# Patient Record
Sex: Female | Born: 1985 | Race: White | Hispanic: No | Marital: Single | Smoking: Current every day smoker
Health system: Southern US, Community
[De-identification: ages and names within clinical notes are randomized; demographics above are authoritative.]

## PROBLEM LIST (undated history)

## (undated) DIAGNOSIS — F1910 Other psychoactive substance abuse, uncomplicated: Secondary | ICD-10-CM

## (undated) DIAGNOSIS — E039 Hypothyroidism, unspecified: Secondary | ICD-10-CM

## (undated) HISTORY — PX: NO PAST SURGERIES: SHX2092

## (undated) HISTORY — DX: Hypothyroidism, unspecified: E03.9

---
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
## 2007-03-23 ENCOUNTER — Ambulatory Visit (HOSPITAL_COMMUNITY): Admission: RE | Admit: 2007-03-23 | Discharge: 2007-03-23 | Payer: Self-pay | Admitting: Internal Medicine

## 2008-03-02 ENCOUNTER — Ambulatory Visit (HOSPITAL_COMMUNITY): Admission: RE | Admit: 2008-03-02 | Discharge: 2008-03-02 | Payer: Self-pay | Admitting: Family Medicine

## 2008-04-14 ENCOUNTER — Emergency Department (HOSPITAL_COMMUNITY): Admission: EM | Admit: 2008-04-14 | Discharge: 2008-04-14 | Payer: Self-pay | Admitting: Emergency Medicine

## 2008-08-02 ENCOUNTER — Emergency Department (HOSPITAL_COMMUNITY): Admission: EM | Admit: 2008-08-02 | Discharge: 2008-08-02 | Payer: Self-pay | Admitting: Emergency Medicine

## 2008-09-28 ENCOUNTER — Emergency Department (HOSPITAL_COMMUNITY): Admission: EM | Admit: 2008-09-28 | Discharge: 2008-09-29 | Payer: Self-pay | Admitting: Emergency Medicine

## 2008-10-24 ENCOUNTER — Emergency Department (HOSPITAL_COMMUNITY): Admission: EM | Admit: 2008-10-24 | Discharge: 2008-10-24 | Payer: Self-pay | Admitting: Emergency Medicine

## 2009-05-19 ENCOUNTER — Emergency Department (HOSPITAL_COMMUNITY): Admission: EM | Admit: 2009-05-19 | Discharge: 2009-05-19 | Payer: Self-pay | Admitting: Emergency Medicine

## 2010-09-09 LAB — URINALYSIS, ROUTINE W REFLEX MICROSCOPIC
Nitrite: NEGATIVE
Urobilinogen, UA: 0.2 mg/dL (ref 0.0–1.0)

## 2010-09-09 LAB — PREGNANCY, URINE: Preg Test, Ur: NEGATIVE

## 2010-09-09 LAB — URINE MICROSCOPIC-ADD ON

## 2010-12-22 ENCOUNTER — Ambulatory Visit: Payer: Self-pay | Admitting: Obstetrics and Gynecology

## 2011-05-01 ENCOUNTER — Other Ambulatory Visit: Payer: Self-pay | Admitting: Internal Medicine

## 2011-05-01 DIAGNOSIS — E0590 Thyrotoxicosis, unspecified without thyrotoxic crisis or storm: Secondary | ICD-10-CM

## 2011-05-06 ENCOUNTER — Ambulatory Visit (HOSPITAL_COMMUNITY): Payer: Self-pay

## 2011-05-07 ENCOUNTER — Other Ambulatory Visit (HOSPITAL_COMMUNITY): Payer: Self-pay

## 2011-06-03 ENCOUNTER — Encounter (HOSPITAL_COMMUNITY)
Admission: RE | Admit: 2011-06-03 | Discharge: 2011-06-03 | Disposition: A | Discharge status: Home / Self Care | Source: Ambulatory Visit | Attending: Internal Medicine | Admitting: Internal Medicine

## 2011-06-03 ENCOUNTER — Encounter (HOSPITAL_COMMUNITY): Payer: Self-pay

## 2011-06-03 DIAGNOSIS — E0590 Thyrotoxicosis, unspecified without thyrotoxic crisis or storm: Secondary | ICD-10-CM | POA: Insufficient documentation

## 2011-06-03 MED ORDER — SODIUM IODIDE I 131 CAPSULE
10.0000 | Freq: Once | INTRAVENOUS | Status: AC | PRN
  Administered 2011-06-03: 9 via ORAL

## 2011-06-04 ENCOUNTER — Ambulatory Visit (HOSPITAL_COMMUNITY): Payer: Self-pay

## 2011-06-04 ENCOUNTER — Encounter (HOSPITAL_COMMUNITY)
Admission: RE | Admit: 2011-06-04 | Discharge: 2011-06-04 | Disposition: A | Discharge status: Home / Self Care | Source: Ambulatory Visit | Attending: Internal Medicine | Admitting: Internal Medicine

## 2011-06-04 MED ORDER — SODIUM PERTECHNETATE TC 99M INJECTION
10.0000 | Freq: Once | INTRAVENOUS | Status: AC | PRN
  Administered 2011-06-04: 9.5 via INTRAVENOUS

## 2011-06-05 ENCOUNTER — Ambulatory Visit (INDEPENDENT_AMBULATORY_CARE_PROVIDER_SITE_OTHER)

## 2011-06-05 DIAGNOSIS — J111 Influenza due to unidentified influenza virus with other respiratory manifestations: Secondary | ICD-10-CM

## 2011-06-30 ENCOUNTER — Ambulatory Visit (INDEPENDENT_AMBULATORY_CARE_PROVIDER_SITE_OTHER): Admitting: Internal Medicine

## 2011-06-30 DIAGNOSIS — N926 Irregular menstruation, unspecified: Secondary | ICD-10-CM | POA: Insufficient documentation

## 2011-06-30 DIAGNOSIS — L659 Nonscarring hair loss, unspecified: Secondary | ICD-10-CM

## 2011-06-30 DIAGNOSIS — E063 Autoimmune thyroiditis: Secondary | ICD-10-CM

## 2011-06-30 DIAGNOSIS — F411 Generalized anxiety disorder: Secondary | ICD-10-CM

## 2011-06-30 DIAGNOSIS — F419 Anxiety disorder, unspecified: Secondary | ICD-10-CM

## 2011-06-30 DIAGNOSIS — L608 Other nail disorders: Secondary | ICD-10-CM

## 2011-06-30 DIAGNOSIS — L603 Nail dystrophy: Secondary | ICD-10-CM | POA: Insufficient documentation

## 2011-06-30 DIAGNOSIS — F329 Major depressive disorder, single episode, unspecified: Secondary | ICD-10-CM

## 2011-06-30 LAB — POCT CBC
Granulocyte percent: 54.1 %G (ref 37–80)
HCT, POC: 37.1 % — AB (ref 37.7–47.9)
Hemoglobin: 12 g/dL — AB (ref 12.2–16.2)
Lymph, poc: 2.4 (ref 0.6–3.4)
MID (cbc): 0.4 (ref 0–0.9)
POC Granulocyte: 3.3 (ref 2–6.9)
POC MID %: 6.6 %M (ref 0–12)
Platelet Count, POC: 278 10*3/uL (ref 142–424)
WBC: 6.1 10*3/uL (ref 4.6–10.2)

## 2011-06-30 LAB — COMPREHENSIVE METABOLIC PANEL
ALT: 14 U/L (ref 0–35)
CO2: 27 mEq/L (ref 19–32)
Potassium: 4.3 mEq/L (ref 3.5–5.3)
Sodium: 139 mEq/L (ref 135–145)
Total Bilirubin: 0.3 mg/dL (ref 0.3–1.2)

## 2011-06-30 MED ORDER — ALPRAZOLAM 1 MG PO TABS: 1.0000 mg | ORAL_TABLET | Freq: Two times a day (BID) | ORAL | Status: DC

## 2011-06-30 MED ORDER — CITALOPRAM HYDROBROMIDE 20 MG PO TABS: 40.0000 mg | ORAL_TABLET | Freq: Every day | ORAL | Status: DC

## 2011-06-30 NOTE — Progress Notes (Addendum)
  Subjective:    Patient ID: Nina Herrera, female    DOB: 11-29-1985, 26 y.o.   MRN: [id number]  HPI Comments: icreased acne....very easily irritated Ankles swell at end of day--ankles lock in am  Hx flat feet  Thyroid Problem Presents for follow-up visit. Symptoms include anxiety, depressed mood, fatigue, hair loss, heat intolerance, menstrual problem, nail problem and weight gain. Patient reports no constipation, diarrhea, dry skin, leg swelling, palpitations, visual change or weight loss. The symptoms have been worsening.   she was originally sent to [place] in November by her gynecologist during her workup for infertility. Her progesterone was low and her thyroid hormones also were we found that she had a low TSH with a high T3 but a normal T4.  Thyroid para oxidase antibody was also positive at 386. She had a mild normocytic anemia with a history of iron deficiency anemia  She has been on Celexa and Xanax for anxiety and depression and was doing very well until the last 2 months. All of her symptoms have exacerbated without any clear psychosocial coughs. Her job and her relationship are both stable here. She continues to have irregular menses and hasn't still not been able to get pregnant.    Review of Systems  Constitutional: Positive for weight gain, activity change, fatigue and unexpected weight change. Negative for weight loss.  Cardiovascular: Negative for palpitations.  Gastrointestinal: Negative for nausea, diarrhea, constipation and abdominal distention.  Genitourinary: Positive for menstrual problem. Negative for frequency and difficulty urinating.  Musculoskeletal: Negative for arthralgias.  Hematological: Positive for heat intolerance.  Psychiatric/Behavioral: Positive for sleep disturbance, dysphoric mood and agitation. Negative for suicidal ideas. The patient is nervous/anxious.    she has only had 4 period since August. She hasn't been evaluated by ophthalmology for clear  discharge from her prescription which they thought was allergic to even though she has no allergic rhinitis by history      Objective:   Physical Exam  Constitutional: She is oriented to person, place, and time. She appears well-developed.  HENT:  Head: Normocephalic.  Eyes: Conjunctivae and EOM are normal. Pupils are equal, round, and reactive to light.  Neck: Normal range of motion. Neck supple. No thyromegaly present.  Cardiovascular: Normal rate, regular rhythm and normal heart sounds.   No murmur heard. Pulmonary/Chest: Breath sounds normal.  Abdominal: Soft.  Lymphadenopathy:    She has no cervical adenopathy.  Neurological: She is alert and oriented to person, place, and time.  Skin: Skin is warm and dry.          Assessment & Plan:  She has a confusing array of symptoms with underlying autoimmune thyroiditis. Her thyroid uptake was essentially normal by scan. She is mainly concerned about her irregular menses and infertility. She is also beginning to be much more uncomfortable with her anxiety and irritability and depression that results.  We will recheck her thyroid hormone and TSH. Her CBC is almost normal today with a hemoglobin of 12. Vitamin D and metabolic profile also done.  Celexa it is increased to 40 mg a day. Xanax is continued at 1 mg twice a day when necessary. She is sleeping well.

## 2011-06-30 NOTE — Patient Instructions (Signed)

## 2011-07-01 LAB — VITAMIN D 25 HYDROXY (VIT D DEFICIENCY, FRACTURES): Vit D, 25-Hydroxy: 30 ng/mL (ref 30–89)

## 2011-07-06 ENCOUNTER — Encounter: Payer: Self-pay | Admitting: Internal Medicine

## 2011-09-06 ENCOUNTER — Emergency Department (HOSPITAL_COMMUNITY)
Admission: EM | Admit: 2011-09-06 | Discharge: 2011-09-06 | Disposition: A | Discharge status: Home / Self Care | Attending: Emergency Medicine | Admitting: Emergency Medicine

## 2011-09-06 ENCOUNTER — Encounter (HOSPITAL_COMMUNITY): Payer: Self-pay

## 2011-09-06 DIAGNOSIS — N949 Unspecified condition associated with female genital organs and menstrual cycle: Secondary | ICD-10-CM | POA: Insufficient documentation

## 2011-09-06 DIAGNOSIS — A499 Bacterial infection, unspecified: Secondary | ICD-10-CM | POA: Insufficient documentation

## 2011-09-06 DIAGNOSIS — N760 Acute vaginitis: Secondary | ICD-10-CM | POA: Insufficient documentation

## 2011-09-06 DIAGNOSIS — F17200 Nicotine dependence, unspecified, uncomplicated: Secondary | ICD-10-CM | POA: Insufficient documentation

## 2011-09-06 DIAGNOSIS — R109 Unspecified abdominal pain: Secondary | ICD-10-CM | POA: Insufficient documentation

## 2011-09-06 DIAGNOSIS — B9689 Other specified bacterial agents as the cause of diseases classified elsewhere: Secondary | ICD-10-CM | POA: Insufficient documentation

## 2011-09-06 DIAGNOSIS — R102 Pelvic and perineal pain: Secondary | ICD-10-CM

## 2011-09-06 LAB — URINALYSIS, ROUTINE W REFLEX MICROSCOPIC
Bilirubin Urine: NEGATIVE
Glucose, UA: NEGATIVE mg/dL
Ketones, ur: NEGATIVE mg/dL
Leukocytes, UA: NEGATIVE
Nitrite: NEGATIVE
Protein, ur: NEGATIVE mg/dL
Specific Gravity, Urine: 1.015 (ref 1.005–1.030)
Urobilinogen, UA: 0.2 mg/dL (ref 0.0–1.0)
pH: 8 (ref 5.0–8.0)

## 2011-09-06 LAB — COMPREHENSIVE METABOLIC PANEL
Alkaline Phosphatase: 60 U/L (ref 39–117)
CO2: 30 mEq/L (ref 19–32)
Calcium: 9.9 mg/dL (ref 8.4–10.5)
Chloride: 100 mEq/L (ref 96–112)
Creatinine, Ser: 0.58 mg/dL (ref 0.50–1.10)
GFR calc Af Amer: >90 mL/min (ref >90)
GFR calc non Af Amer: >90 mL/min (ref >90)
Glucose, Bld: 77 mg/dL (ref 70–99)
Potassium: 3.7 mEq/L (ref 3.5–5.1)
Sodium: 137 mEq/L (ref 135–145)
Total Protein: 7.6 g/dL (ref 6.0–8.3)

## 2011-09-06 LAB — CBC
HCT: 35.9 % — ABNORMAL LOW (ref 36.0–46.0)
MCH: 31 pg (ref 26.0–34.0)
MCHC: 35.1 g/dL (ref 30.0–36.0)
RBC: 4.07 MIL/uL (ref 3.87–5.11)
WBC: 7.2 10*3/uL (ref 4.0–10.5)

## 2011-09-06 LAB — PREGNANCY, URINE: Preg Test, Ur: NEGATIVE

## 2011-09-06 LAB — WET PREP, GENITAL

## 2011-09-06 LAB — DIFFERENTIAL
Basophils Absolute: 0 10*3/uL (ref 0.0–0.1)
Eosinophils Absolute: 0.4 10*3/uL (ref 0.0–0.7)

## 2011-09-06 MED ORDER — TRAMADOL HCL 50 MG PO TABS
100.0000 mg | ORAL_TABLET | Freq: Once | ORAL | Status: DC
  Filled 2011-09-06: qty 2

## 2011-09-06 MED ORDER — KETOROLAC TROMETHAMINE 30 MG/ML IJ SOLN
30.0000 mg | Freq: Once | INTRAMUSCULAR | Status: AC
  Administered 2011-09-06: 30 mg via INTRAVENOUS
  Filled 2011-09-06: qty 1

## 2011-09-06 NOTE — ED Notes (Signed)
Notified by Gaspar Garbe, nurse tech, and annalisa, patient advocate, that patient's significant other also voices concerns to them that we thought the patient was drug seeking. Patient's significant other stated to me that there were plenty of people in the waiting room that were obviously drug seeking.

## 2011-09-06 NOTE — ED Notes (Signed)
Patient's significant other came out of room and asked patient advocate annalisa if ultrasound was here on the weekends. annalisa informed patient that they were not and that they're called in for emergencies. Patient voiced concern about ultrasound not being here today. Went into patient's room, patient's significant other stated that by his assessment we thought the patient was drug seeking due to the medications we gave her. Patient and significant other began walking out of room, stated they weren't signing any paperwork because then they would have to pay.

## 2011-09-06 NOTE — ED Notes (Signed)
Pelvic cart set up at bedside  

## 2011-09-06 NOTE — ED Notes (Signed)
Went into patient's room to give patient tramadol pills, patient tearful and denies pain medication. States that she doesn't want anything else for pain. Also requested iv to be removed, stated she didn't see any reason why it needed to be in her arm anymore. Iv removed.

## 2011-09-06 NOTE — ED Provider Notes (Signed)
History   Scribed for Ward Givens, MD, the patient was seen in APOTF/OTF. The chart was scribed by Gilman Schmidt. The patients care was started at 9:13 PM.   CSN: [id number]  Arrival date & time 09/06/11  1801   First MD Initiated Contact with Patient 09/06/11 1847      Chief Complaint  Patient presents with  . Abdominal Pain    (Consider location/radiation/quality/duration/timing/severity/associated sxs/prior treatment) HPI Nina Herrera is a 26 y.o. female who presents to the Emergency Department complaining of lower abdominal pain ~1 hour PTA. Pt reports having sexual intercourse when symptoms presented. Symptoms are exacerbated upon movement. Denies any change from normal in sexual activity. Also notes increased urination. Denies any dysuria, flank pain, nausea, vomiting, or vaginal discharge. LNMP March 14. Pt states she has had thyroid problems since last August and has had irregular menstrual periods but they are starting to get more regular. States she has never been pregnant but is not using birth control and is trying to get pregnant. Pt also reports having ultrasound of abdomen that showed ovarian cysts several years ago while being evaluated for a different problem. There are no other associated symptoms. Pt relates laying flat makes the pain worse, the fetal position makes it feel better.    PCP: Dr. Merla Riches, Gynecologist: Dr. Marla Roe in [place]    History reviewed. No pertinent past medical history.  History reviewed. No pertinent past surgical history.  No family history on file.  History  Substance Use Topics  . Smoking status: Current Everyday Smoker    Types: Cigarettes  . Smokeless tobacco: Not on file  . Alcohol Use: No  employed Lives with spouse   OB History    Grav Para Term Preterm Abortions TAB SAB Ect Mult Living                  Review of Systems  Gastrointestinal: Positive for abdominal pain. Negative for vomiting.  Genitourinary: Positive for  frequency. Negative for dysuria, flank pain and vaginal discharge.  All other systems reviewed and are negative.    Allergies  Review of patient's allergies indicates no known allergies.  Home Medications   Current Outpatient Rx  Name Route Sig Dispense Refill  . ALPRAZOLAM 1 MG PO TABS Oral Take 1 tablet (1 mg total) by mouth 2 (two) times daily. 60 tablet 2    BP 93/57  Pulse 71  Temp(Src) 98.6 F (37 C) (Oral)  Resp 20  Ht 5\' 6"  (1.676 m)  Wt 130 lb (58.968 kg)  BMI 20.98 kg/m2  SpO2 100%  LMP 08/13/2011  Vital signs normal except borderline hypotension   Physical Exam  Constitutional: She appears well-developed and well-nourished.  HENT:  Head: Normocephalic and atraumatic.  Right Ear: External ear normal.  Left Ear: External ear normal.  Nose: Nose normal.  Eyes: Conjunctivae are normal. Pupils are equal, round, and reactive to light.  Neck: Normal range of motion. Neck supple. No tracheal deviation present. No thyromegaly present.  Cardiovascular: Normal rate, regular rhythm and normal heart sounds.   No murmur heard. Pulmonary/Chest: Effort normal and breath sounds normal.  Abdominal: Soft. She exhibits no distension and no mass. Bowel sounds are increased. There is tenderness in the left lower quadrant. There is no rebound, no guarding and no CVA tenderness.       Pt has tenderness in her LLQ.  Genitourinary:       Small amount of discharge Both ovaries tender Mildly tender uterus  Normal size uterus  Musculoskeletal: Normal range of motion. She exhibits no edema and no tenderness.  Neurological: She is alert. Coordination normal.  Skin: Skin is warm and dry. No rash noted.  Psychiatric: She has a normal mood and affect.    ED Course  Procedures (including critical care time)   Medications  traMADol (ULTRAM) tablet 100 mg (100 mg Oral Not Given 09/06/11 2038)  ketorolac (TORADOL) 30 MG/ML injection 30 mg (30 mg Intravenous Given 09/06/11 1923)   I  discussed coming back in the am to get a pelvic US which is not available her tonight unless she were pregnant. Pt seemed agreeable. Discussed giving her a note for work in the AM to explain why she would be late.    Nurse reports pt's husband googled tramadol and toradol and told nurse they must think you are drug seeking and had nurse pull out IV and left.   DIAGNOSTIC STUDIES: Oxygen Saturation is 100% on room air, normal by my interpretation.    LABS Results for orders placed during the hospital encounter of 09/06/11  URINALYSIS, ROUTINE W REFLEX MICROSCOPIC      Component Value Range   Color, Urine YELLOW  YELLOW    APPearance CLOUDY (*) CLEAR    Specific Gravity, Urine 1.015  1.005 - 1.030    pH 8.0  5.0 - 8.0    Glucose, UA NEGATIVE  NEGATIVE (mg/dL)   Hgb urine dipstick NEGATIVE  NEGATIVE    Bilirubin Urine NEGATIVE  NEGATIVE    Ketones, ur NEGATIVE  NEGATIVE (mg/dL)   Protein, ur NEGATIVE  NEGATIVE (mg/dL)   Urobilinogen, UA 0.2  0.0 - 1.0 (mg/dL)   Nitrite NEGATIVE  NEGATIVE    Leukocytes, UA NEGATIVE  NEGATIVE   PREGNANCY, URINE      Component Value Range   Preg Test, Ur NEGATIVE  NEGATIVE   CBC      Component Value Range   WBC 7.2  4.0 - 10.5 (K/uL)   RBC 4.07  3.87 - 5.11 (MIL/uL)   Hemoglobin 12.6  12.0 - 15.0 (g/dL)   HCT 16.1 (*) 09.6 - 46.0 (%)   MCV 88.2  78.0 - 100.0 (fL)   MCH 31.0  26.0 - 34.0 (pg)   MCHC 35.1  30.0 - 36.0 (g/dL)   RDW 04.5  40.9 - 81.1 (%)   Platelets 225  150 - 400 (K/uL)  DIFFERENTIAL      Component Value Range   Neutrophils Relative 47  43 - 77 (%)   Neutro Abs 3.4  1.7 - 7.7 (K/uL)   Lymphocytes Relative 41  12 - 46 (%)   Lymphs Abs 3.0  0.7 - 4.0 (K/uL)   Monocytes Relative 6  3 - 12 (%)   Monocytes Absolute 0.5  0.1 - 1.0 (K/uL)   Eosinophils Relative 6 (*) 0 - 5 (%)   Eosinophils Absolute 0.4  0.0 - 0.7 (K/uL)   Basophils Relative 0  0 - 1 (%)   Basophils Absolute 0.0  0.0 - 0.1 (K/uL)  COMPREHENSIVE METABOLIC PANEL       Component Value Range   Sodium 137  135 - 145 (mEq/L)   Potassium 3.7  3.5 - 5.1 (mEq/L)   Chloride 100  96 - 112 (mEq/L)   CO2 30  19 - 32 (mEq/L)   Glucose, Bld 77  70 - 99 (mg/dL)   BUN 13  6 - 23 (mg/dL)   Creatinine, Ser 9.14  0.50 - 1.10 (mg/dL)  Calcium 9.9  8.4 - 10.5 (mg/dL)   Total Protein 7.6  6.0 - 8.3 (g/dL)   Albumin 4.2  3.5 - 5.2 (g/dL)   AST 17  0 - 37 (U/L)   ALT 10  0 - 35 (U/L)   Alkaline Phosphatase 60  39 - 117 (U/L)   Total Bilirubin 0.1 (*) 0.3 - 1.2 (mg/dL)   GFR calc non Af Amer >90  >90 (mL/min)   GFR calc Af Amer >90  >90 (mL/min)  WET PREP, GENITAL      Component Value Range   Yeast Wet Prep HPF POC NONE SEEN  NONE SEEN    Trich, Wet Prep NONE SEEN  NONE SEEN    Clue Cells Wet Prep HPF POC FEW (*) NONE SEEN    WBC, Wet Prep HPF POC FEW (*) NONE SEEN    Laboratory interpretation all normal except bacterial vaginosis   COORDINATION OF CARE: 7:08pm:  - Patient evaluated by ED physician, Toradol, GC chlamydia, Wet prep, CBC, Diff, CMP, UA, Pregnancy urine ordered 8:14pm: Pelvic Exam performed.    1. Pelvic pain   2. Bacterial vaginosis    Pt signed out AMA while waiting for [place] appointment time tomorrow.   Devoria Albe, MD, FACEP   MDM   I personally performed the services described in this documentation, which was scribed in my presence. The recorded information has been reviewed and considered. Devoria Albe, MD, Armando Gang        Ward Givens, MD 09/06/11 2116

## 2011-09-06 NOTE — ED Notes (Signed)
After removing iv, patient stated that she is upset that ultrasound cannot perform an ultrasound today. Significant other states he can't believe that this is a hospital and we do not have an ultrasound tech here at this time, asked what we do if there is someone who is pregnant and is having issues. Stated that ultrasound is here during the week for certain hours, and that they can be called in if it is an emergency as determined by an EDP. Notified patient that we were waiting for results from pelvic exam swabs to come back before discharge. Asked patient again if she wanted tramadol tablets, patient refused.

## 2011-09-06 NOTE — ED Notes (Signed)
Pt reports having severe lower quad bil ab pain for apporx 2 hours pta.  Denies any urinary s/s, no vaginal discharge

## 2011-09-26 ENCOUNTER — Telehealth: Payer: Self-pay

## 2011-09-26 DIAGNOSIS — F419 Anxiety disorder, unspecified: Secondary | ICD-10-CM

## 2011-09-26 MED ORDER — ALPRAZOLAM 1 MG PO TABS: 1.0000 mg | ORAL_TABLET | Freq: Two times a day (BID) | ORAL | Status: DC

## 2011-09-26 NOTE — Telephone Encounter (Signed)
Done   Nina Herrera

## 2011-09-26 NOTE — Telephone Encounter (Signed)
Rx faxed in and patient notified. 

## 2011-09-26 NOTE — Telephone Encounter (Signed)
Pt. Needs refill on prescription Zanax. Please let pt know when done. Pharmacy [facility] [phone]

## 2011-09-28 ENCOUNTER — Telehealth: Payer: Self-pay | Admitting: Family Medicine

## 2011-09-28 DIAGNOSIS — F419 Anxiety disorder, unspecified: Secondary | ICD-10-CM

## 2011-09-28 NOTE — Telephone Encounter (Signed)
Spoke with patient and let her know rx was called into [place] [facility].

## 2011-09-28 NOTE — Telephone Encounter (Signed)
Patient stated that her rx for xanax needed to go to [facility] in [place].

## 2011-09-28 NOTE — Telephone Encounter (Signed)
Please verify that it was faxed to the appropriate pharm.  If it wasn't, call it in and document.

## 2011-10-21 ENCOUNTER — Telehealth: Payer: Self-pay

## 2011-10-21 MED ORDER — ALPRAZOLAM 1 MG PO TABS: 1.0000 mg | ORAL_TABLET | Freq: Two times a day (BID) | ORAL | Status: DC

## 2011-10-21 NOTE — Telephone Encounter (Signed)
Done

## 2011-10-21 NOTE — Telephone Encounter (Signed)
Tried to [facility] at # pt left for [place], but VM not set up. LM w/mother at home # that RF was sent to [facility]

## 2011-10-21 NOTE — Telephone Encounter (Addendum)
Pt needs a refill on alprazolam 1mg  called into [facility] in [place]. Pt states that she is not out yet but she would like to receive a refill before Dr.Doolittle leaves for vacation. Please call [phone] Pharmacy: [phone] [facility]

## 2011-11-27 ENCOUNTER — Telehealth: Payer: Self-pay

## 2011-11-27 DIAGNOSIS — F419 Anxiety disorder, unspecified: Secondary | ICD-10-CM

## 2011-11-27 NOTE — Telephone Encounter (Signed)
The patient called to request refill of her Alprazolam 1 mg tablet Rx.  The patient states the last Rx she received was not sent to the correct pharmacy.  The patient stated she needs the Rx called into the [facility].  Please call the patient at [phone] when the Rx has been sent to the pharmacy.

## 2011-11-28 MED ORDER — ALPRAZOLAM 1 MG PO TABS: 1.0000 mg | ORAL_TABLET | Freq: Two times a day (BID) | ORAL | Status: DC

## 2011-11-28 NOTE — Telephone Encounter (Signed)
Spoke with pt and notified that we are faxing her med over and needs to recheck with Merla Riches next 3-4 weeks.

## 2011-11-28 NOTE — Telephone Encounter (Signed)
Done and ready to be faxed to pharmacy. It also looks like she was supposed to follow up to repeat her thyroid tests.

## 2011-11-28 NOTE — Telephone Encounter (Signed)
Called patient, VM not setup-- will try later.

## 2011-11-28 NOTE — Telephone Encounter (Signed)
pts second call for rx refill. Pt is out and needs refill Please call pt to advise

## 2012-01-04 ENCOUNTER — Telehealth: Payer: Self-pay

## 2012-01-04 DIAGNOSIS — F419 Anxiety disorder, unspecified: Secondary | ICD-10-CM

## 2012-01-04 NOTE — Telephone Encounter (Signed)
Ok to refill 

## 2012-01-04 NOTE — Telephone Encounter (Signed)
Pt would like to get a refill on her xanax rx at [facility] in [place]

## 2012-01-05 MED ORDER — ALPRAZOLAM 1 MG PO TABS: 1.0000 mg | ORAL_TABLET | Freq: Two times a day (BID) | ORAL | Status: DC

## 2012-01-05 NOTE — Telephone Encounter (Signed)
Called in Rx and LMOM on [facility] (H # - OK per HIPPA) VM because pt's VM hasn't been set up. Pt then CB and spoke w/Amy who told her Rx was sent in and she needs OV for more. Pt agreed.

## 2012-01-05 NOTE — Telephone Encounter (Signed)
Done and printed. Please remind patient that she does need to follow up for repeat thyroid studies before we can fill this again for her.

## 2012-09-19 ENCOUNTER — Emergency Department (HOSPITAL_COMMUNITY)

## 2012-09-19 ENCOUNTER — Encounter (HOSPITAL_COMMUNITY): Payer: Self-pay | Admitting: *Deleted

## 2012-09-19 ENCOUNTER — Emergency Department (HOSPITAL_COMMUNITY)
Admission: EM | Admit: 2012-09-19 | Discharge: 2012-09-19 | Disposition: A | Discharge status: Home / Self Care | Attending: Emergency Medicine | Admitting: Emergency Medicine

## 2012-09-19 DIAGNOSIS — N898 Other specified noninflammatory disorders of vagina: Secondary | ICD-10-CM | POA: Insufficient documentation

## 2012-09-19 DIAGNOSIS — O26899 Other specified pregnancy related conditions, unspecified trimester: Secondary | ICD-10-CM

## 2012-09-19 DIAGNOSIS — R1032 Left lower quadrant pain: Secondary | ICD-10-CM | POA: Insufficient documentation

## 2012-09-19 DIAGNOSIS — O218 Other vomiting complicating pregnancy: Secondary | ICD-10-CM | POA: Insufficient documentation

## 2012-09-19 DIAGNOSIS — O99330 Smoking (tobacco) complicating pregnancy, unspecified trimester: Secondary | ICD-10-CM | POA: Insufficient documentation

## 2012-09-19 DIAGNOSIS — R350 Frequency of micturition: Secondary | ICD-10-CM | POA: Insufficient documentation

## 2012-09-19 DIAGNOSIS — O9989 Other specified diseases and conditions complicating pregnancy, childbirth and the puerperium: Secondary | ICD-10-CM | POA: Insufficient documentation

## 2012-09-19 DIAGNOSIS — Z79899 Other long term (current) drug therapy: Secondary | ICD-10-CM | POA: Insufficient documentation

## 2012-09-19 LAB — CBC WITH DIFFERENTIAL/PLATELET
Basophils Relative: 0 % (ref 0–1)
Eosinophils Absolute: 0.1 10*3/uL (ref 0.0–0.7)
HCT: 33.4 % — ABNORMAL LOW (ref 36.0–46.0)
Hemoglobin: 12 g/dL (ref 12.0–15.0)
MCH: 31.7 pg (ref 26.0–34.0)
MCHC: 35.9 g/dL (ref 30.0–36.0)
Monocytes Absolute: 0.4 10*3/uL (ref 0.1–1.0)
Monocytes Relative: 5 % (ref 3–12)

## 2012-09-19 LAB — URINALYSIS, ROUTINE W REFLEX MICROSCOPIC
Bilirubin Urine: NEGATIVE
Glucose, UA: NEGATIVE mg/dL
pH: 6 (ref 5.0–8.0)

## 2012-09-19 LAB — WET PREP, GENITAL
Clue Cells Wet Prep HPF POC: NONE SEEN
Trich, Wet Prep: NONE SEEN

## 2012-09-19 NOTE — ED Notes (Signed)
No vaginal discharge or spotting.  Pain in LLQ began abruptly 1.5 hours ago, has been constant at 7/10.  Has had nausea over last week. No known sick contacts.

## 2012-09-19 NOTE — ED Notes (Signed)
Pt approx [redacted] wks pregnant - c/o sharp LLQ pain that started approx 1.5 hrs PTA.  Denies n/v/d.

## 2012-09-19 NOTE — ED Notes (Signed)
Patient with no complaints at this time. Respirations even and unlabored. Skin warm/dry. Discharge instructions reviewed with patient at this time. Patient given opportunity to voice concerns/ask questions. Patient discharged at this time and left Emergency Department with steady gait.   

## 2012-09-19 NOTE — ED Provider Notes (Signed)
History     CSN: [id number]  Arrival date & time 09/19/12  1914   First MD Initiated Contact with Patient 09/19/12 1141      Chief Complaint  Patient presents with  . Abdominal Pain    (Consider location/radiation/quality/duration/timing/severity/associated sxs/prior treatment) Patient is a 27 y.o. female presenting with abdominal pain. The history is provided by the patient.  Abdominal Pain Pain location:  LLQ Pain quality: sharp and shooting   Pain radiates to:  Does not radiate Pain severity:  Moderate Onset quality:  Sudden Duration:  1 hour Timing:  Constant Progression:  Waxing and waning Chronicity:  New Relieved by:  Nothing Ineffective treatments:  None tried Associated symptoms: nausea   Associated symptoms: no chest pain, no chills, no dysuria, no fever and no shortness of breath   Risk factors: pregnancy     History reviewed. No pertinent past medical history.  History reviewed. No pertinent past surgical history.  No family history on file.  History  Substance Use Topics  . Smoking status: Current Every Day Smoker    Types: Cigarettes  . Smokeless tobacco: Not on file  . Alcohol Use: No    OB History   Grav Para Term Preterm Abortions TAB SAB Ect Mult Living   1               Review of Systems  Constitutional: Negative for fever, chills and activity change.  HENT: Negative for neck pain.   Eyes: Negative for visual disturbance.  Respiratory: Negative for shortness of breath.   Cardiovascular: Negative for chest pain.  Gastrointestinal: Positive for nausea and abdominal pain.  Genitourinary: Positive for frequency. Negative for dysuria and urgency.       Pregnant   Musculoskeletal: Negative for back pain.  Skin: Negative for rash.  Neurological: Negative for headaches.  Psychiatric/Behavioral: The patient is not nervous/anxious.     Allergies  Review of patient's allergies indicates no known allergies.  Home Medications   Current  Outpatient Rx  Name  Route  Sig  Dispense  Refill  . Prenatal Vit-Fe Fumarate-FA (PRENATAL MULTIVITAMIN) TABS   Oral   Take 1 tablet by mouth daily at 12 noon.           BP 95/52  Pulse 64  Temp(Src) 98.9 F (37.2 C) (Oral)  Resp 16  Ht 5\' 6"  (1.676 m)  Wt 114 lb (51.71 kg)  BMI 18.41 kg/m2  SpO2 100%  Physical Exam  Nursing note and vitals reviewed. Constitutional: She is oriented to person, place, and time. She appears well-developed and well-nourished.  HENT:  Head: Normocephalic.  Eyes: EOM are normal.  Neck: Neck supple.  Cardiovascular: Normal rate and regular rhythm.   Pulmonary/Chest: Effort normal. No respiratory distress.  Abdominal: Soft. There is tenderness (minimal tenderness with palpation LLQ). There is no rigidity, no rebound, no guarding and no CVA tenderness.  Genitourinary:  External genitalia without lesions. White discharge vaginal vault. Cervix long, closed, no CMT, mildly tender L adnexa. Uterus approximately 8 week size.  Musculoskeletal: Normal range of motion.  Neurological: She is alert and oriented to person, place, and time. No cranial nerve deficit.  Skin: Skin is warm and dry.  Psychiatric: She has a normal mood and affect. Her behavior is normal. Judgment and thought content normal.    ED Course  Procedures (including critical care time)  Results for orders placed during the hospital encounter of 09/19/12 (from the past 24 hour(s))  URINALYSIS, ROUTINE W REFLEX MICROSCOPIC  Status: Abnormal   Collection Time    09/19/12  9:20 AM      Result Value Range   Color, Urine YELLOW  YELLOW   APPearance CLEAR  CLEAR   Specific Gravity, Urine >1.030 (*) 1.005 - 1.030   pH 6.0  5.0 - 8.0   Glucose, UA NEGATIVE  NEGATIVE mg/dL   Hgb urine dipstick NEGATIVE  NEGATIVE   Bilirubin Urine NEGATIVE  NEGATIVE   Ketones, ur TRACE (*) NEGATIVE mg/dL   Protein, ur NEGATIVE  NEGATIVE mg/dL   Urobilinogen, UA 0.2  0.0 - 1.0 mg/dL   Nitrite NEGATIVE   NEGATIVE   Leukocytes, UA NEGATIVE  NEGATIVE  CBC WITH DIFFERENTIAL     Status: Abnormal   Collection Time    09/19/12 11:49 AM      Result Value Range   WBC 6.9  4.0 - 10.5 K/uL   RBC 3.78 (*) 3.87 - 5.11 MIL/uL   Hemoglobin 12.0  12.0 - 15.0 g/dL   HCT 40.9 (*) 81.1 - 91.4 %   MCV 88.4  78.0 - 100.0 fL   MCH 31.7  26.0 - 34.0 pg   MCHC 35.9  30.0 - 36.0 g/dL   RDW 78.2  95.6 - 21.3 %   Platelets 247  150 - 400 K/uL   Neutrophils Relative 67  43 - 77 %   Neutro Abs 4.6  1.7 - 7.7 K/uL   Lymphocytes Relative 26  12 - 46 %   Lymphs Abs 1.8  0.7 - 4.0 K/uL   Monocytes Relative 5  3 - 12 %   Monocytes Absolute 0.4  0.1 - 1.0 K/uL   Eosinophils Relative 1  0 - 5 %   Eosinophils Absolute 0.1  0.0 - 0.7 K/uL   Basophils Relative 0  0 - 1 %   Basophils Absolute 0.0  0.0 - 0.1 K/uL  HCG, QUANTITATIVE, PREGNANCY     Status: Abnormal   Collection Time    09/19/12 11:49 AM      Result Value Range   hCG, Beta Chain, Quant, [place] [address] (*) <5 mIU/mL  ABO/RH     Status: None   Collection Time    09/19/12 11:49 AM      Result Value Range   ABO/RH(D) O NEG      US Ob Comp Less 14 Wks  09/19/2012  *RADIOLOGY REPORT*  Clinical Data: Pregnancy, left lower quadrant pain, quantitative beta HCG = 68,596  OBSTETRIC <14 WK [place] AND TRANSVAGINAL OB US  Technique:  Both transabdominal and transvaginal ultrasound examinations were performed for complete evaluation of the gestation as well as the maternal uterus, adnexal regions, and pelvic cul-de-sac.  Transvaginal technique was performed to assess early pregnancy.  Comparison:  None  Intrauterine gestational sac:  Visualized/normal in shape. Yolk sac: Present Embryo: Present Cardiac Activity: Present Heart Rate: 143 bpm  CRL: 32.5  mm  8 w  3 d             [place] EDC: 05/07/2013  Maternal uterus/adnexae: No subchorionic hemorrhage. Right ovary measures 4.2 x 2.6 x 2.4 cm with a small collapsed corpus luteal cyst. Left ovary normal size and morphology 3.7 x 1.7 x  1.7 cm. No adnexal masses or free pelvic fluid.  IMPRESSION: Single live intrauterine gestation measured at 8 weeks 3 days EGA by crown-rump length. No acute abnormalities.   Original Report Authenticated By: Ulyses Southward, M.D.    US Ob Transvaginal  09/19/2012  *RADIOLOGY REPORT*  Clinical Data: Pregnancy, left lower quadrant pain, quantitative beta HCG = 68,596  OBSTETRIC <14 WK [place] AND TRANSVAGINAL OB US  Technique:  Both transabdominal and transvaginal ultrasound examinations were performed for complete evaluation of the gestation as well as the maternal uterus, adnexal regions, and pelvic cul-de-sac.  Transvaginal technique was performed to assess early pregnancy.  Comparison:  None  Intrauterine gestational sac:  Visualized/normal in shape. Yolk sac: Present Embryo: Present Cardiac Activity: Present Heart Rate: 143 bpm  CRL: 32.5  mm  8 w  3 d             [place] EDC: 05/07/2013  Maternal uterus/adnexae: No subchorionic hemorrhage. Right ovary measures 4.2 x 2.6 x 2.4 cm with a small collapsed corpus luteal cyst. Left ovary normal size and morphology 3.7 x 1.7 x 1.7 cm. No adnexal masses or free pelvic fluid.  IMPRESSION: Single live intrauterine gestation measured at 8 weeks 3 days EGA by crown-rump length. No acute abnormalities.   Original Report Authenticated By: Ulyses Southward, M.D.     MDM  Assessment: 27 y.o. female with abdominal pain in early pregnancy  Plan:  Discussed with the patient results of ultrasound and abdominal pain in pregnancy   Patient to return to her OB for follow up   Tylenol prn discomfort.   I have reviewed this patient's vital signs, nurses notes, appropriate labs and imaging.  I have discussed results with the patient and plan of care. Patient voices understanding.  Patient without pain at this time and stable for discharge home.        [address] [place], [place] 09/20/12 [phone]

## 2012-09-28 NOTE — ED Provider Notes (Signed)
Medical screening examination/treatment/procedure(s) were performed by non-physician practitioner and as supervising physician I was immediately available for consultation/collaboration.   Kenslie Abbruzzese L Consuelo Thayne, MD 09/28/12 1608 

## 2013-02-13 ENCOUNTER — Encounter: Payer: Self-pay | Admitting: Internal Medicine

## 2013-10-14 ENCOUNTER — Ambulatory Visit (INDEPENDENT_AMBULATORY_CARE_PROVIDER_SITE_OTHER): Payer: Self-pay | Admitting: Internal Medicine

## 2013-10-14 VITALS — BP 112/64 | HR 72 | Temp 99.1°F | Resp 17 | Ht 65.25 in | Wt 122.4 lb

## 2013-10-14 DIAGNOSIS — F411 Generalized anxiety disorder: Secondary | ICD-10-CM

## 2013-10-14 DIAGNOSIS — F819 Developmental disorder of scholastic skills, unspecified: Secondary | ICD-10-CM

## 2013-10-14 DIAGNOSIS — R6250 Unspecified lack of expected normal physiological development in childhood: Secondary | ICD-10-CM

## 2013-10-14 DIAGNOSIS — F419 Anxiety disorder, unspecified: Secondary | ICD-10-CM

## 2013-10-14 DIAGNOSIS — E063 Autoimmune thyroiditis: Secondary | ICD-10-CM

## 2013-10-14 MED ORDER — ALPRAZOLAM 1 MG PO TABS: 1.0000 mg | ORAL_TABLET | Freq: Two times a day (BID) | ORAL | Status: DC | PRN

## 2013-10-14 NOTE — Progress Notes (Signed)
Subjective:    Patient ID: Nina Herrera, female    DOB: [birth year], 28 y.o.   MRN: [id number]  This chart was scribed for Ellamae Siaobert Keithan Dileonardo, MD by Bronson CurbJacqueline Melvin, ED Scribe. This patient was seen in room Room/bed 8 and the patient's care was started at 1:23 PM.   HPI  HPI Comments: Nina Herrera is a 28 y.o. female who presents to the [facility] for medication refill. She states she wants to go back on Alprazolam. Patient reports she can not make a phone call or run general errands without becoming anxious. Procrastinates with all encounters. Patient is distressed and crying as she describes her level of anxiety. 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   She reports significant family history of anxiety and depression. She has had anxiety problems since late childhood and was first started on Xanax at age 28. Was first seen here 3-4 yrs ago after a divorce and started Celexa/alprazolam which helped for awhile. Remarried, and stop the medication as she had pregnancy-healthy five-month old at home.  She recently received a car from her uncle and states she could not get tags and insurance due to her anxiety. She reports she procrastinates and refuses to answer the phone because she is "afraid of failure if she tries".  Recent family stress -She states her grandmother has history of seizures, and suffered a "serious one" on Mother's day and states she has been worried about her as well. She states her mind races while trying to sleep, but does not want to start on a sleep medication. Patient states her aunt is a Librarian, academicdoctor in [place]..  She states she is "great in social situations" with people she knows. Patient does her "own yoga", dances, gardens and has decreased caffeine intake with some relief.  Patient reports she has not taken Buspar, Wellbutrin, or Effexor but has  tried other SSRIs and does not like the side effects.  Patient also reports she had an uneventful pregnancy and delivered 5 months ago without complication. She states she is terrified about putting her infant son in daycare while she is at radiology school. She denies post partum depression.   Patient reports she was diagnosed with ADD and other learning problems after being evaluated for a learning disability at her high school. She states her grandmother-who raised her(delinquent MOm) never medicated her for this. 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 She states she would like to resume tx for ADD while in radiology school.  Patient also reports she has lump on her left breast and is unsure of the source. She denies tenderness and reports this is not similar to cystic acne she has had in the past.  Patient has taken Prozac, Lexapro,  and Celexa. Patient also reports taking Abilify, but states she "did not like it".   Review of Systems  Constitutional: Negative for fever, activity change, appetite change, fatigue and unexpected weight change.  Eyes: Negative for visual disturbance.  Respiratory: Negative for shortness of breath.   Cardiovascular: Negative for chest pain and palpitations.  Endocrine:       History of autoimmune thyroiditis 3 years ago which resolved without treatment although followup has not been maintained. Currently no symptoms of hypo-or hyperthyroidism  Neurological: Negative for headaches.  Psychiatric/Behavioral: Positive for sleep disturbance. Negative for dysphoric mood. The patient is  nervous/anxious.   All other systems reviewed and are negative.      Objective:   Physical Exam  Nursing note and vitals reviewed. Constitutional: She is oriented to person, place, and time. She appears well-developed and well-nourished. No distress.  HENT:  Head: Normocephalic and atraumatic.  Eyes: Conjunctivae and EOM are  normal. Pupils are equal, round, and reactive to light.  Neck: Neck supple. No thyromegaly present.  Cardiovascular: Normal rate.   Pulmonary/Chest: Effort normal. No respiratory distress.  Neurological: She is alert and oriented to person, place, and time. No cranial nerve deficit.  Skin: Skin is warm and dry.  She has a small subcutaneous nodule it is slightly tender over the lateral aspect of the left breast in the upper quadrant which is inspissated hair follicle  Psychiatric: Judgment and thought content normal.  Is anxious with a labile mood exhibiting tears at times during the discussion          Assessment & Plan:  Anxiety-restart alprazolam/consider other possible maintenance medications-Effexor as one  Possibility//consider CBT  Autoimmune thyroiditis by history and labs 2012.-We will need to repeat her thyroid studies at some  point  Learning difficulty--she will obtain her past evaluation for us to review before any treatment, or will be  reevaluated by Dr. Ty HiltsKincaid    I have completed the patient encounter in its entirety as documented by the scribe, with editing by me where necessary. Rainbow Salman P. Merla Richesoolittle, M.D.

## 2014-03-08 ENCOUNTER — Other Ambulatory Visit: Payer: Self-pay | Admitting: *Deleted

## 2014-03-08 NOTE — Telephone Encounter (Signed)
Received fax request for refill. Please advise.

## 2014-03-09 MED ORDER — ALPRAZOLAM 1 MG PO TABS: 1.0000 mg | ORAL_TABLET | Freq: Two times a day (BID) | ORAL | Status: DC | PRN

## 2014-03-09 NOTE — Telephone Encounter (Signed)
Faxed Rx

## 2014-04-02 ENCOUNTER — Telehealth: Payer: Self-pay

## 2014-04-02 NOTE — Telephone Encounter (Signed)
Done 10/2 w/ 5 refills

## 2014-04-02 NOTE — Telephone Encounter (Signed)
Patient is requesting a refill on her ALPRAZolam Prudy Feeler(XANAX) 1 MG tablet Please call when sent to pharmacy per patient. Thank you  Best: [phone]

## 2014-04-03 NOTE — Telephone Encounter (Signed)
Spoke to pt- advised to check with pharmacy.

## 2014-09-25 ENCOUNTER — Other Ambulatory Visit: Payer: Self-pay

## 2014-09-25 NOTE — Telephone Encounter (Signed)
Patient is requesting a refill on her "Alprazolam 1 mg".  Patient stated due to her school schedule she has not be able to see him by an appointment. Per patient she will come to the walk in clinic on may 2nd or may 6th. She is requesting to get another medication to get her till that time. If able to authorize medication patient request to be called at [phone] when ready to be picked up. She is completely out of her medication

## 2014-09-26 MED ORDER — ALPRAZOLAM 1 MG PO TABS: 1.0000 mg | ORAL_TABLET | Freq: Two times a day (BID) | ORAL | Status: DC | PRN

## 2014-09-27 NOTE — Telephone Encounter (Signed)
Spoke w/pt who asked me to fax to pharm. Done.

## 2014-09-28 ENCOUNTER — Telehealth: Payer: Self-pay

## 2014-09-28 NOTE — Telephone Encounter (Signed)
i have already taken care of this 

## 2014-09-28 NOTE — Telephone Encounter (Signed)
Patient went to pick up her prescription from the pharmacy and the pharmacy never received it through fax. I confirmed the fax number and this is what they gave me: [phone] Please resend and call patient when ready! [phone]

## 2014-11-23 ENCOUNTER — Ambulatory Visit (INDEPENDENT_AMBULATORY_CARE_PROVIDER_SITE_OTHER): Payer: Self-pay | Admitting: Internal Medicine

## 2014-11-23 VITALS — BP 98/60 | HR 82 | Temp 98.5°F | Resp 14 | Ht 66.0 in | Wt 119.2 lb

## 2014-11-23 DIAGNOSIS — F419 Anxiety disorder, unspecified: Secondary | ICD-10-CM

## 2014-11-23 MED ORDER — ALPRAZOLAM 1 MG PO TABS: 1.0000 mg | ORAL_TABLET | Freq: Three times a day (TID) | ORAL | Status: DC | PRN

## 2014-11-23 MED ORDER — PAROXETINE HCL 10 MG PO TABS: 10.0000 mg | ORAL_TABLET | Freq: Every day | ORAL | Status: DC

## 2014-11-23 NOTE — Progress Notes (Signed)
Subjective:   This chart was scribed for Nina Sia, MD by Broadus John, Medical Scribe. This patient was seen in Room 1 and the patient's care was started at 10:15 AM.   Patient ID: Nina Herrera, female    DOB: 01-18-1986, 29 y.o.   MRN: [id number]  HPI HPI Comments: Chief Complaint  Patient presents with  . Medication Refill    Alprazolam    [name] is a 29 y.o. female with a PMHx of anxiety, who presents to [facility] for medication refill for Alprazolam.She reports that she is interested in taking an additional pill a day.  Pt notes that her anxiety has become more severe in the past few days. She states that she still has her problem of procrastination as a result of her anxiety. Pt notes that she has taken the following medications previously for her symptoms: Cylexa, prozac, cymbalta, lexapro, Zoloft, wellbutrin, however, she has not seen any major improvements with any except relief with Cylexa, however, it gave her suicidal when she missed it for 4 days, and therefore she stopped taking it.  Pt reports having trouble sleeping during the night time, although she attributes this problem to her light-sleeping pattern. She does have mind racing at bedtime.   Pt states that she believes that her life events such as her difficult childhood are the cause of her current symptoms. 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 She indicated that she is planning to come off the medication in about a year, and starts seeing a psychiatrist/psychol. Long history of problems. Raised by her grandmother because her mother was a cocaine addict. Trouble now with her grandmothers estate for which she is the power of attorney. Some of the dependence creating problems.  Review of Systems  Psychiatric/Behavioral: Positive for sleep disturbance. The patient is nervous/anxious.       Objective:   Physical Exam    Constitutional: She is oriented to person, place, and time. She appears well-developed and well-nourished. No distress.  HENT:  Head: Normocephalic and atraumatic.  Mouth/Throat: Oropharynx is clear and moist. No oropharyngeal exudate.  Eyes: Pupils are equal, round, and reactive to light.  Neck: Neck supple.  Cardiovascular: Normal rate.   Pulmonary/Chest: Effort normal.  Musculoskeletal: She exhibits no edema.  Neurological: She is alert and oriented to person, place, and time. No cranial nerve deficit.  Skin: Skin is warm and dry. No rash noted.  Psychiatric: She has a normal mood and affect. Her behavior is normal.  Nursing note and vitals reviewed.  BP 98/60 mmHg  Pulse 82  Temp(Src) 98.5 F (36.9 C) (Oral)  Resp 14  Ht  (1.676 m)  Wt 119 lb 3.2 oz (54.069 kg)  BMI 19.25 kg/m2  SpO2 98%     Assessment & Plan:    I have completed the patient encounter in its entirety as documented by the scribe, with editing by me where necessary. Robert P. Merla Riches, M.D. Anxiety  Meds ordered this encounter  Medications  . ALPRAZolam (XANAX) 1 MG tablet    Sig: Take 1 tablet (1 mg total) by mouth 3 (three) times daily as needed for anxiety.    Dispense:  90 tablet    Refill:  5 We increase this medicine to help provide better ability to fall asleep and stay asleep ///she realizes the risk of addiction and realizes the need to use this medicine as seldom  as possible   . PARoxetine (PAXIL) 10 MG tablet    Sig: Take 1 tablet (10 mg total) by mouth daily.    Dispense:  30 tablet    Refill:  5   Call with any problems with Paxil--- call if no change in 6 weeks Follow-up 6 months

## 2015-04-29 ENCOUNTER — Encounter: Payer: Self-pay | Admitting: Internal Medicine

## 2015-04-29 ENCOUNTER — Other Ambulatory Visit: Payer: Self-pay | Admitting: Internal Medicine

## 2015-05-01 NOTE — Telephone Encounter (Signed)
Faxed

## 2015-05-24 ENCOUNTER — Telehealth: Payer: Self-pay

## 2015-05-24 NOTE — Telephone Encounter (Signed)
Pt called req. Refill on ALPRAZolam (XANAX) 1 MG tablet [[id number]][[id number]]   [phone]

## 2015-05-27 NOTE — Telephone Encounter (Signed)
Needs follow up in Jan I believe last OV 10/2014

## 2015-05-28 ENCOUNTER — Telehealth: Payer: Self-pay | Admitting: Internal Medicine

## 2015-05-28 MED ORDER — ALPRAZOLAM 1 MG PO TABS: 1.0000 mg | ORAL_TABLET | Freq: Three times a day (TID) | ORAL | Status: DC | PRN

## 2015-05-28 NOTE — Telephone Encounter (Signed)
Rx called in 

## 2015-05-28 NOTE — Telephone Encounter (Signed)
Signed to call in

## 2015-07-01 ENCOUNTER — Other Ambulatory Visit: Payer: Self-pay

## 2015-07-01 NOTE — Telephone Encounter (Signed)
[phone]  Refill request on ALPRAZolam Nina Herrera) 1 MG tablet  Dr Merla Riches

## 2015-07-02 MED ORDER — ALPRAZOLAM 1 MG PO TABS: 1.0000 mg | ORAL_TABLET | Freq: Three times a day (TID) | ORAL | Status: DC | PRN

## 2015-07-02 NOTE — Telephone Encounter (Signed)
Meds ordered this encounter  Medications  . ALPRAZolam (XANAX) 1 MG tablet    Sig: Take 1 tablet (1 mg total) by mouth 3 (three) times daily as needed.    Dispense:  90 tablet    Refill:  1   Ok 2 mos--but need to see her once more before i retire to discuss transition

## 2015-07-03 ENCOUNTER — Ambulatory Visit: Payer: Self-pay | Admitting: Internal Medicine

## 2015-07-03 NOTE — Telephone Encounter (Signed)
Called in Rx and notified pt of RFs and need for f/up w/in 2 mos on VM.

## 2015-08-28 ENCOUNTER — Other Ambulatory Visit: Payer: Self-pay | Admitting: Internal Medicine

## 2015-08-28 NOTE — Telephone Encounter (Signed)
Meds ordered this encounter  Medications  . ALPRAZolam (XANAX) 1 MG tablet    Sig: TAKE (1) TABLET BY MOUTH THREE TIMES DAILY AS NEEDED.    Dispense:  90 tablet    Refill:  0   Needs to f/u before another rx AND needs to decide about new PCP--? Near her [place] in [place] area??

## 2015-08-28 NOTE — Telephone Encounter (Signed)
Does she need to RTC last time seen 6/16?

## 2015-08-29 NOTE — Telephone Encounter (Signed)
Tried to call pt about f/up. H# doesn't accept incoming calls, VM full on cell #, but sent a SMS notification w/our ph # to pt. Hopefully, she will call back.

## 2015-08-30 ENCOUNTER — Other Ambulatory Visit: Payer: Self-pay | Admitting: Internal Medicine

## 2015-08-31 ENCOUNTER — Telehealth: Payer: Self-pay

## 2015-08-31 NOTE — Telephone Encounter (Signed)
Pt called in to check on prescription and I informed her that she needs an office visit before she can get another prescription. Pt understood & she'll be in on Monday 09/02/15

## 2015-09-02 ENCOUNTER — Ambulatory Visit (INDEPENDENT_AMBULATORY_CARE_PROVIDER_SITE_OTHER): Payer: Self-pay | Admitting: Internal Medicine

## 2015-09-02 VITALS — BP 105/70 | HR 88 | Temp 98.1°F | Resp 16 | Ht 66.0 in | Wt 103.0 lb

## 2015-09-02 DIAGNOSIS — F411 Generalized anxiety disorder: Secondary | ICD-10-CM

## 2015-09-02 MED ORDER — ALPRAZOLAM 1 MG PO TABS: ORAL_TABLET | ORAL | Status: DC

## 2015-09-02 NOTE — Progress Notes (Signed)
   Subjective:    Patient ID: Nina Herrera, female    DOB: [birth year], 30 y.o.   MRN: [id number] By signing my name below, I, Nina Herrera, attest that this documentation has been prepared under the direction and in the presence of Ellamae Siaobert Doolittle, MD.  Electronically Signed: Littie Deedsichard Herrera, Medical Scribe. 09/02/2015. 5:57 PM.  HPI HPI Comments: Nina Herrera is a 30 y.o. female with a history of anxiety and depression who presents to the [facility] for a medication refill for GAD- Xanax. She is off of the Paxil--this too did little as other past treatments.Xanax has been helping with her symptoms-taking the Xanax 2-3 times a day.  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 Apparently on this for a few years. Feels stable and states she does not need any support services.  Patient is in school right now. 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 She is looking into business programs at [employer]. She lives in [place] with her son and does not have family around, but she does have two good friends around. Patient split up with her partner about 6 months ago. Her partner is back in jail now. She enjoys gardening and cleaning the house in her spare time.  Review of Systems [place]    Objective:   Physical Exam  Constitutional: She is oriented to person, place, and time. She appears well-developed and well-nourished. No distress.  HENT:  Head: Normocephalic and atraumatic.  Eyes: Conjunctivae and EOM are normal. Pupils are equal, round, and reactive to light.  Neck: Neck supple.  Cardiovascular: Normal rate.   Pulmonary/Chest: Effort normal.  Musculoskeletal: She exhibits no edema.  Neurological: She is alert and oriented to person, place, and time. No cranial nerve deficit.  Skin: Skin is warm and dry. No rash noted.  Psychiatric: She has a normal  mood and affect. Her behavior is normal. Judgment and thought content normal.  Nursing note and vitals reviewed. BP 105/70 mmHg  Pulse 88  Temp(Src) 98.1 F (36.7 C)  Resp 16  Ht 5\' 6"  (1.676 m)  Wt 103 lb (46.72 kg)  BMI 16.63 kg/m2     Assessment & Plan:  GAD (generalized anxiety disorder)  Meds ordered this encounter  Medications  . ALPRAZolam (XANAX) 1 MG tablet    Sig: TAKE (1) TABLET BY MOUTH THREE TIMES DAILY AS NEEDED.    Dispense:  90 tablet    Refill:  5   She is given a list of FPs in [place] for f/u as i retire.  I have completed the patient encounter in its entirety as documented by the scribe, with editing by me where necessary. Robert P. Merla Richesoolittle, M.D.

## 2015-09-02 NOTE — Telephone Encounter (Signed)
Mailbox is still full

## 2015-09-02 NOTE — Patient Instructions (Signed)
     IF you received an x-ray today, you will receive an invoice from [facility]. Please contact [facility] at [phone] with questions or concerns regarding your invoice.   IF you received labwork today, you will receive an invoice from [facility]. Please contact [facility] at [phone] with questions or concerns regarding your invoice.   Our billing staff will not be able to assist you with questions regarding bills from these companies.  You will be contacted with the lab results as soon as they are available. The fastest way to get your results is to activate your My Chart account. Instructions are located on the last page of this paperwork. If you have not heard from us regarding the results in 2 weeks, please contact this office.      

## 2016-01-20 ENCOUNTER — Other Ambulatory Visit: Payer: Self-pay | Admitting: Obstetrics and Gynecology

## 2016-01-20 DIAGNOSIS — O3680X0 Pregnancy with inconclusive fetal viability, not applicable or unspecified: Secondary | ICD-10-CM

## 2016-01-24 ENCOUNTER — Ambulatory Visit (INDEPENDENT_AMBULATORY_CARE_PROVIDER_SITE_OTHER): Payer: Medicaid Other

## 2016-01-24 DIAGNOSIS — Z3A08 8 weeks gestation of pregnancy: Secondary | ICD-10-CM | POA: Diagnosis not present

## 2016-01-24 DIAGNOSIS — O3680X0 Pregnancy with inconclusive fetal viability, not applicable or unspecified: Secondary | ICD-10-CM | POA: Diagnosis not present

## 2016-01-24 NOTE — Progress Notes (Signed)
US 7+2 wks,single IUP w/ys, pos fht 138 bpm,normal rt ov,unable to see lt ov,lt adnexa wnl

## 2016-02-06 ENCOUNTER — Encounter: Payer: Medicaid Other | Admitting: Advanced Practice Midwife

## 2016-02-12 ENCOUNTER — Encounter: Payer: Self-pay | Admitting: Advanced Practice Midwife

## 2016-02-12 ENCOUNTER — Ambulatory Visit (INDEPENDENT_AMBULATORY_CARE_PROVIDER_SITE_OTHER): Payer: Medicaid Other | Admitting: Advanced Practice Midwife

## 2016-02-12 VITALS — BP 100/60 | HR 80 | Wt 119.0 lb

## 2016-02-12 DIAGNOSIS — O0991 Supervision of high risk pregnancy, unspecified, first trimester: Secondary | ICD-10-CM

## 2016-02-12 DIAGNOSIS — Z331 Pregnant state, incidental: Secondary | ICD-10-CM

## 2016-02-12 DIAGNOSIS — Z3A10 10 weeks gestation of pregnancy: Secondary | ICD-10-CM | POA: Diagnosis not present

## 2016-02-12 DIAGNOSIS — F1120 Opioid dependence, uncomplicated: Secondary | ICD-10-CM | POA: Insufficient documentation

## 2016-02-12 DIAGNOSIS — E038 Other specified hypothyroidism: Secondary | ICD-10-CM

## 2016-02-12 DIAGNOSIS — F1121 Opioid dependence, in remission: Secondary | ICD-10-CM

## 2016-02-12 DIAGNOSIS — Z3491 Encounter for supervision of normal pregnancy, unspecified, first trimester: Secondary | ICD-10-CM

## 2016-02-12 DIAGNOSIS — Z1389 Encounter for screening for other disorder: Secondary | ICD-10-CM | POA: Diagnosis not present

## 2016-02-12 DIAGNOSIS — F419 Anxiety disorder, unspecified: Secondary | ICD-10-CM

## 2016-02-12 DIAGNOSIS — Z3490 Encounter for supervision of normal pregnancy, unspecified, unspecified trimester: Secondary | ICD-10-CM | POA: Insufficient documentation

## 2016-02-12 DIAGNOSIS — O99321 Drug use complicating pregnancy, first trimester: Secondary | ICD-10-CM | POA: Diagnosis not present

## 2016-02-12 DIAGNOSIS — O99341 Other mental disorders complicating pregnancy, first trimester: Secondary | ICD-10-CM | POA: Diagnosis not present

## 2016-02-12 DIAGNOSIS — E039 Hypothyroidism, unspecified: Secondary | ICD-10-CM | POA: Insufficient documentation

## 2016-02-12 DIAGNOSIS — Z3682 Encounter for antenatal screening for nuchal translucency: Secondary | ICD-10-CM

## 2016-02-12 DIAGNOSIS — Z369 Encounter for antenatal screening, unspecified: Secondary | ICD-10-CM

## 2016-02-12 DIAGNOSIS — Z0283 Encounter for blood-alcohol and blood-drug test: Secondary | ICD-10-CM

## 2016-02-12 LAB — POCT URINALYSIS DIPSTICK
GLUCOSE UA: NEGATIVE
Ketones, UA: NEGATIVE
Leukocytes, UA: NEGATIVE
NITRITE UA: NEGATIVE
PROTEIN UA: NEGATIVE
RBC UA: NEGATIVE

## 2016-02-12 MED ORDER — DOXYLAMINE-PYRIDOXINE 10-10 MG PO TBEC: DELAYED_RELEASE_TABLET | ORAL | 3 refills | Status: DC

## 2016-02-12 NOTE — Patient Instructions (Signed)
 First Trimester of Pregnancy The first trimester of pregnancy is from week 1 until the end of week 12 (months 1 through 3). A week after a sperm fertilizes an egg, the egg will implant on the wall of the uterus. This embryo will begin to develop into a baby. Genes from you and your partner are forming the baby. The female genes determine whether the baby is a boy or a girl. At 6-8 weeks, the eyes and face are formed, and the heartbeat can be seen on ultrasound. At the end of 12 weeks, all the baby's organs are formed.  Now that you are pregnant, you will want to do everything you can to have a healthy baby. Two of the most important things are to get good prenatal care and to follow your health care provider's instructions. Prenatal care is all the medical care you receive before the baby's birth. This care will help prevent, find, and treat any problems during the pregnancy and childbirth. BODY CHANGES Your body goes through many changes during pregnancy. The changes vary from woman to woman.   You may gain or lose a couple of pounds at first.  You may feel sick to your stomach (nauseous) and throw up (vomit). If the vomiting is uncontrollable, call your health care provider.  You may tire easily.  You may develop headaches that can be relieved by medicines approved by your health care provider.  You may urinate more often. Painful urination may mean you have a bladder infection.  You may develop heartburn as a result of your pregnancy.  You may develop constipation because certain hormones are causing the muscles that push waste through your intestines to slow down.  You may develop hemorrhoids or swollen, bulging veins (varicose veins).  Your breasts may begin to grow larger and become tender. Your nipples may stick out more, and the tissue that surrounds them (areola) may become darker.  Your gums may bleed and may be sensitive to brushing and flossing.  Dark spots or blotches  (chloasma, mask of pregnancy) may develop on your face. This will likely fade after the baby is born.  Your menstrual periods will stop.  You may have a loss of appetite.  You may develop cravings for certain kinds of food.  You may have changes in your emotions from day to day, such as being excited to be pregnant or being concerned that something may go wrong with the pregnancy and baby.  You may have more vivid and strange dreams.  You may have changes in your hair. These can include thickening of your hair, rapid growth, and changes in texture. Some women also have hair loss during or after pregnancy, or hair that feels dry or thin. Your hair will most likely return to normal after your baby is born. WHAT TO EXPECT AT YOUR PRENATAL VISITS During a routine prenatal visit:  You will be weighed to make sure you and the baby are growing normally.  Your blood pressure will be taken.  Your abdomen will be measured to track your baby's growth.  The fetal heartbeat will be listened to starting around week 10 or 12 of your pregnancy.  Test results from any previous visits will be discussed. Your health care provider may ask you:  How you are feeling.  If you are feeling the baby move.  If you have had any abnormal symptoms, such as leaking fluid, bleeding, severe headaches, or abdominal cramping.  If you have any questions. Other   tests that may be performed during your first trimester include:  Blood tests to find your blood type and to check for the presence of any previous infections. They will also be used to check for low iron levels (anemia) and Rh antibodies. Later in the pregnancy, blood tests for diabetes will be done along with other tests if problems develop.  Urine tests to check for infections, diabetes, or protein in the urine.  An ultrasound to confirm the proper growth and development of the baby.  An amniocentesis to check for possible genetic problems.  Fetal  screens for spina bifida and Down syndrome.  You may need other tests to make sure you and the baby are doing well. HOME CARE INSTRUCTIONS  Medicines  Follow your health care provider's instructions regarding medicine use. Specific medicines may be either safe or unsafe to take during pregnancy.  Take your prenatal vitamins as directed.  If you develop constipation, try taking a stool softener if your health care provider approves. Diet  Eat regular, well-balanced meals. Choose a variety of foods, such as meat or vegetable-based protein, fish, milk and low-fat dairy products, vegetables, fruits, and whole grain breads and cereals. Your health care provider will help you determine the amount of weight gain that is right for you.  Avoid raw meat and uncooked cheese. These carry germs that can cause birth defects in the baby.  Eating four or five small meals rather than three large meals a day may help relieve nausea and vomiting. If you start to feel nauseous, eating a few soda crackers can be helpful. Drinking liquids between meals instead of during meals also seems to help nausea and vomiting.  If you develop constipation, eat more high-fiber foods, such as fresh vegetables or fruit and whole grains. Drink enough fluids to keep your urine clear or pale yellow. Activity and Exercise  Exercise only as directed by your health care provider. Exercising will help you:  Control your weight.  Stay in shape.  Be prepared for labor and delivery.  Experiencing pain or cramping in the lower abdomen or low back is a good sign that you should stop exercising. Check with your health care provider before continuing normal exercises.  Try to avoid standing for long periods of time. Move your legs often if you must stand in one place for a long time.  Avoid heavy lifting.  Wear low-heeled shoes, and practice good posture.  You may continue to have sex unless your health care provider directs you  otherwise. Relief of Pain or Discomfort  Wear a good support bra for breast tenderness.   Take warm sitz baths to soothe any pain or discomfort caused by hemorrhoids. Use hemorrhoid cream if your health care provider approves.   Rest with your legs elevated if you have leg cramps or low back pain.  If you develop varicose veins in your legs, wear support hose. Elevate your feet for 15 minutes, 3-4 times a day. Limit salt in your diet. Prenatal Care  Schedule your prenatal visits by the twelfth week of pregnancy. They are usually scheduled monthly at first, then more often in the last 2 months before delivery.  Write down your questions. Take them to your prenatal visits.  Keep all your prenatal visits as directed by your health care provider. Safety  Wear your seat belt at all times when driving.  Make a list of emergency phone numbers, including numbers for family, friends, the hospital, and police and fire departments. General   Tips  Ask your health care provider for a referral to a local prenatal education class. Begin classes no later than at the beginning of month 6 of your pregnancy.  Ask for help if you have counseling or nutritional needs during pregnancy. Your health care provider can offer advice or refer you to specialists for help with various needs.  Do not use hot tubs, steam rooms, or saunas.  Do not douche or use tampons or scented sanitary pads.  Do not cross your legs for long periods of time.  Avoid cat litter boxes and soil used by cats. These carry germs that can cause birth defects in the baby and possibly loss of the fetus by miscarriage or stillbirth.  Avoid all smoking, herbs, alcohol, and medicines not prescribed by your health care provider. Chemicals in these affect the formation and growth of the baby.  Schedule a dentist appointment. At home, brush your teeth with a soft toothbrush and be gentle when you floss. SEEK MEDICAL CARE IF:   You have  dizziness.  You have mild pelvic cramps, pelvic pressure, or nagging pain in the abdominal area.  You have persistent nausea, vomiting, or diarrhea.  You have a bad smelling vaginal discharge.  You have pain with urination.  You notice increased swelling in your face, hands, legs, or ankles. SEEK IMMEDIATE MEDICAL CARE IF:   You have a fever.  You are leaking fluid from your vagina.  You have spotting or bleeding from your vagina.  You have severe abdominal cramping or pain.  You have rapid weight gain or loss.  You vomit blood or material that looks like coffee grounds.  You are exposed to German measles and have never had them.  You are exposed to fifth disease or chickenpox.  You develop a severe headache.  You have shortness of breath.  You have any kind of trauma, such as from a fall or a car accident. Document Released: 05/12/2001 Document Revised: 10/02/2013 Document Reviewed: 03/28/2013 [employer] Patient Information 2015 [employer]. This information is not intended to replace advice given to you by your health care provider. Make sure you discuss any questions you have with your health care provider.   Nausea & Vomiting  Have saltine crackers or pretzels by your bed and eat a few bites before you raise your head out of bed in the morning  Eat small frequent meals throughout the day instead of large meals  Drink plenty of fluids throughout the day to stay hydrated, just don't drink a lot of fluids with your meals.  This can make your stomach fill up faster making you feel sick  Do not brush your teeth right after you eat  Products with real ginger are good for nausea, like ginger ale and ginger hard candy Make sure it says made with real ginger!  Sucking on sour candy like lemon heads is also good for nausea  If your prenatal vitamins make you nauseated, take them at night so you will sleep through the nausea  Sea Bands  If you feel like you need  medicine for the nausea & vomiting please let us know  If you are unable to keep any fluids or food down please let us know   Constipation  Drink plenty of fluid, preferably water, throughout the day  Eat foods high in fiber such as fruits, vegetables, and grains  Exercise, such as walking, is a good way to keep your bowels regular  Drink warm fluids, especially warm   prune juice, or decaf coffee  Eat a 1/2 cup of real oatmeal (not instant), 1/2 cup applesauce, and 1/2-1 cup warm prune juice every day  If needed, you may take Colace (docusate sodium) stool softener once or twice a day to help keep the stool soft. If you are pregnant, wait until you are out of your first trimester (12-14 weeks of pregnancy)  If you still are having problems with constipation, you may take Miralax once daily as needed to help keep your bowels regular.  If you are pregnant, wait until you are out of your first trimester (12-14 weeks of pregnancy)  Safe Medications in Pregnancy   Acne: Benzoyl Peroxide Salicylic Acid  Backache/Headache: Tylenol: 2 regular strength every 4 hours OR              2 Extra strength every 6 hours  Colds/Coughs/Allergies: Benadryl (alcohol free) 25 mg every 6 hours as needed Breath right strips Claritin Cepacol throat lozenges Chloraseptic throat spray Cold-Eeze- up to three times per day Cough drops, alcohol free Flonase (by prescription only) Guaifenesin Mucinex Robitussin DM (plain only, alcohol free) Saline nasal spray/drops Sudafed (pseudoephedrine) & Actifed ** use only after [redacted] weeks gestation and if you do not have high blood pressure Tylenol Vicks Vaporub Zinc lozenges Zyrtec   Constipation: Colace Ducolax suppositories Fleet enema Glycerin suppositories Metamucil Milk of magnesia Miralax Senokot Smooth move tea  Diarrhea: Kaopectate Imodium A-D  *NO pepto Bismol  Hemorrhoids: Anusol Anusol HC Preparation  H Tucks  Indigestion: Tums Maalox Mylanta Zantac  Pepcid  Insomnia: Benadryl (alcohol free) 25mg every 6 hours as needed Tylenol PM Unisom, no Gelcaps  Leg Cramps: Tums MagGel  Nausea/Vomiting:  Bonine Dramamine Emetrol Ginger extract Sea bands Meclizine  Nausea medication to take during pregnancy:  Unisom (doxylamine succinate 25 mg tablets) Take one tablet daily at bedtime. If symptoms are not adequately controlled, the dose can be increased to a maximum recommended dose of two tablets daily (1/2 tablet in the morning, 1/2 tablet mid-afternoon and one at bedtime). Vitamin B6 100mg tablets. Take one tablet twice a day (up to 200 mg per day).  Skin Rashes: Aveeno products Benadryl cream or 25mg every 6 hours as needed Calamine Lotion 1% cortisone cream  Yeast infection: Gyne-lotrimin 7 Monistat 7   **If taking multiple medications, please check labels to avoid duplicating the same active ingredients **take medication as directed on the label ** Do not exceed 4000 mg of tylenol in 24 hours **Do not take medications that contain aspirin or ibuprofen      

## 2016-02-12 NOTE — Progress Notes (Signed)
  Subjective:    Nina Herrera is a G1P0 776w0d being seen today for her first obstetrical visit.  Her obstetrical history is significant for term SVD without problems.  Pregnancy history fully reviewed. She states that she stopped methadone in March 2014 (note from PCP in April says early 2017-pt thinks he must have misunderstood). Last filled Rx for Xanax 1mg  TID #90 on 9.7:  Pt states she quit cold [place] 2 weeks ago, took 1/2 of one yesterday. Discussed that cold [place] xanax was not a good idea and could cause seizures.  Pt states that she "is doing fine" not taking it, and doesn't plan to become a regular user again.   States she has had both hypo and hyper thyroidism. Chart says autoimmue thyroiditis, but no ongoing treatment. Will check Thyroid panel after 12 weeks (less likely to have false low TSH)   Patient reports nausea.  Vitals:   02/12/16 1509  BP: 100/60  Pulse: 80  Weight: 119 lb (54 kg)    HISTORY: OB History  Gravida Para Term Preterm AB Living  2 1 1     1   SAB TAB Ectopic Multiple Live Births          1    # Outcome Date GA Lbr Len/2nd Weight Sex Delivery Anes PTL Lv  2 Current           1 Term 05/03/13 9260w5d  7 lb (3.175 kg) M  EPI N LIV     Past Medical History:  Diagnosis Date  . Hypothyroidism    age 30,    History reviewed. No pertinent surgical history. History reviewed. No pertinent family history.   Exam                                      System:     Skin: normal coloration and turgor, no rashes    Neurologic: oriented, normal, normal mood   Extremities: normal strength, tone, and muscle mass   HEENT PERRLA   Mouth/Teeth mucous membranes moist, normal dentition   Neck supple and no masses   Cardiovascular: regular rate and rhythm   Respiratory:  appears well, vitals normal, no respiratory distress, acyanotic   Abdomen: soft, non-tender;  FHR: 160us          Assessment:    Pregnancy: G1P0 Patient Active Problem List   Diagnosis Date Noted  . Supervision of normal pregnancy 02/12/2016  . 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 Opioid dependence in remission (HCC) 02/12/2016  . Hypothyroidism   . GAD (generalized anxiety disorder) 09/02/2015  . Irregular menses 06/30/2011  . Anxiety 06/30/2011  . Hair loss 06/30/2011  . Brittle nails 06/30/2011  . Autoimmune thyroiditis 06/30/2011  . Depression 06/30/2011        Plan:     Initial labs drawn. Continue prenatal vitamins  Problem list reviewed and updated  Rx Diclegis and prioir auth done Reviewed n/v relief measures and warning s/s to report  Reviewed recommended weight gain based on pre-gravid BMI  Encouraged well-balanced diet Genetic Screening discussed Integrated Screen: requested.  Ultrasound discussed; fetal survey: requested.  Return in about 2 weeks (around 02/26/2016) for LROB, US:NT+1st IT.  Herrera,Nina Kiernan 02/12/2016

## 2016-02-14 LAB — URINE CULTURE: Organism ID, Bacteria: NO GROWTH

## 2016-02-18 ENCOUNTER — Encounter: Payer: Self-pay | Admitting: Advanced Practice Midwife

## 2016-02-18 ENCOUNTER — Telehealth: Payer: Self-pay | Admitting: Advanced Practice Midwife

## 2016-02-18 DIAGNOSIS — O26899 Other specified pregnancy related conditions, unspecified trimester: Secondary | ICD-10-CM

## 2016-02-18 DIAGNOSIS — Z6791 Unspecified blood type, Rh negative: Secondary | ICD-10-CM | POA: Insufficient documentation

## 2016-02-18 DIAGNOSIS — F1910 Other psychoactive substance abuse, uncomplicated: Secondary | ICD-10-CM | POA: Insufficient documentation

## 2016-02-19 LAB — URINALYSIS, ROUTINE W REFLEX MICROSCOPIC
Bilirubin, UA: NEGATIVE
Glucose, UA: NEGATIVE
KETONES UA: NEGATIVE
Leukocytes, UA: NEGATIVE
Nitrite, UA: NEGATIVE
PH UA: 6 (ref 5.0–7.5)
Protein, UA: NEGATIVE
RBC UA: NEGATIVE
Specific Gravity, UA: 1.022 (ref 1.005–1.030)
UUROB: 0.2 mg/dL (ref 0.2–1.0)

## 2016-02-19 LAB — PMP SCREEN PROFILE (10S), URINE
Amphetamine Screen, Ur: NEGATIVE ng/mL
BENZODIAZEPINE SCREEN, URINE: POSITIVE ng/mL
Barbiturate Screen, Ur: NEGATIVE ng/mL
CREATININE(CRT), U: 132.5 mg/dL (ref 20.0–300.0)
Cannabinoids Ur Ql Scn: NEGATIVE ng/mL
Cocaine(Metab.)Screen, Urine: POSITIVE ng/mL
METHADONE SCREEN, URINE: NEGATIVE ng/mL
OXYCODONE+OXYMORPHONE UR QL SCN: NEGATIVE ng/mL
Opiate Scrn, Ur: POSITIVE ng/mL
PCP SCRN UR: NEGATIVE ng/mL
PH UR, DRUG SCRN: 5.6 (ref 4.5–8.9)
PROPOXYPHENE SCREEN: NEGATIVE ng/mL

## 2016-02-19 LAB — CBC
HEMATOCRIT: 36 % (ref 34.0–46.6)
Hemoglobin: 12.3 g/dL (ref 11.1–15.9)
MCH: 31.2 pg (ref 26.6–33.0)
MCHC: 34.2 g/dL (ref 31.5–35.7)
MCV: 91 fL (ref 79–97)
PLATELETS: 309 10*3/uL (ref 150–379)
RBC: 3.94 x10E6/uL (ref 3.77–5.28)
RDW: 12.9 % (ref 12.3–15.4)
WBC: 9.1 10*3/uL (ref 3.4–10.8)

## 2016-02-19 LAB — HEPATITIS B SURFACE ANTIGEN: HEP B S AG: NEGATIVE

## 2016-02-19 LAB — RUBELLA SCREEN: RUBELLA: 1.01 {index} (ref >0.99)

## 2016-02-19 LAB — RPR: RPR: NONREACTIVE

## 2016-02-19 LAB — ANTIBODY SCREEN: Antibody Screen: NEGATIVE

## 2016-02-19 LAB — VARICELLA ZOSTER ANTIBODY, IGG: Varicella zoster IgG: 1132 index (ref >165)

## 2016-02-19 LAB — ABO/RH: Rh Factor: NEGATIVE

## 2016-02-19 LAB — CYSTIC FIBROSIS MUTATION 97: Interpretation: NOT DETECTED

## 2016-02-19 LAB — HIV ANTIBODY (ROUTINE TESTING W REFLEX): HIV SCREEN 4TH GENERATION: NONREACTIVE

## 2016-02-28 ENCOUNTER — Ambulatory Visit (INDEPENDENT_AMBULATORY_CARE_PROVIDER_SITE_OTHER): Payer: Medicaid Other | Admitting: Obstetrics and Gynecology

## 2016-02-28 ENCOUNTER — Ambulatory Visit (INDEPENDENT_AMBULATORY_CARE_PROVIDER_SITE_OTHER): Payer: Medicaid Other

## 2016-02-28 VITALS — HR 94 | Wt 117.6 lb

## 2016-02-28 DIAGNOSIS — Z331 Pregnant state, incidental: Secondary | ICD-10-CM

## 2016-02-28 DIAGNOSIS — Z36 Encounter for antenatal screening of mother: Secondary | ICD-10-CM | POA: Diagnosis not present

## 2016-02-28 DIAGNOSIS — Z3A13 13 weeks gestation of pregnancy: Secondary | ICD-10-CM

## 2016-02-28 DIAGNOSIS — Z3682 Encounter for antenatal screening for nuchal translucency: Secondary | ICD-10-CM

## 2016-02-28 DIAGNOSIS — Z1389 Encounter for screening for other disorder: Secondary | ICD-10-CM

## 2016-02-28 LAB — POCT URINALYSIS DIPSTICK
GLUCOSE UA: NEGATIVE
KETONES UA: NEGATIVE
Leukocytes, UA: NEGATIVE
Nitrite, UA: NEGATIVE
RBC UA: NEGATIVE

## 2016-02-28 NOTE — Progress Notes (Signed)
US 12+2 wks,measurements c/w dates,normal ov's bilat,NB present,NT 1.5 mm,crl 60.6 mm,fhr 173 bpm

## 2016-03-02 ENCOUNTER — Encounter: Payer: Self-pay | Admitting: *Deleted

## 2016-03-02 ENCOUNTER — Encounter: Payer: Medicaid Other | Admitting: Obstetrics and Gynecology

## 2016-04-20 ENCOUNTER — Ambulatory Visit (INDEPENDENT_AMBULATORY_CARE_PROVIDER_SITE_OTHER): Payer: Medicaid Other | Admitting: Obstetrics and Gynecology

## 2016-04-20 VITALS — BP 108/52 | HR 95 | Wt 132.0 lb

## 2016-04-20 DIAGNOSIS — Z331 Pregnant state, incidental: Secondary | ICD-10-CM

## 2016-04-20 DIAGNOSIS — Z3402 Encounter for supervision of normal first pregnancy, second trimester: Secondary | ICD-10-CM

## 2016-04-20 DIAGNOSIS — O99342 Other mental disorders complicating pregnancy, second trimester: Secondary | ICD-10-CM

## 2016-04-20 DIAGNOSIS — Z3A20 20 weeks gestation of pregnancy: Secondary | ICD-10-CM

## 2016-04-20 DIAGNOSIS — O0992 Supervision of high risk pregnancy, unspecified, second trimester: Secondary | ICD-10-CM | POA: Diagnosis not present

## 2016-04-20 DIAGNOSIS — O99322 Drug use complicating pregnancy, second trimester: Secondary | ICD-10-CM

## 2016-04-20 DIAGNOSIS — F419 Anxiety disorder, unspecified: Secondary | ICD-10-CM

## 2016-04-20 DIAGNOSIS — Z1389 Encounter for screening for other disorder: Secondary | ICD-10-CM

## 2016-04-20 LAB — POCT URINALYSIS DIPSTICK
Glucose, UA: NEGATIVE
KETONES UA: NEGATIVE
Leukocytes, UA: NEGATIVE
NITRITE UA: NEGATIVE
Protein, UA: NEGATIVE
RBC UA: NEGATIVE

## 2016-04-20 NOTE — Progress Notes (Addendum)
G2P1001  Estimated Date of Delivery: 09/09/16 LROB 10747w5d  Blood pressure (!) 108/52, pulse 95, weight 132 lb (59.9 kg).   Urine results:negative  Chief Complaint  Patient presents with  . Routine Prenatal Visit    Patient complaints: none at this time. Patient states she currently takes Xanax, states she is currently tapering herself off and has not refilled her prescription. Patient reports   good fetal movement,                           denies any bleeding , rupture of membranes,or regular contractions.   refer to the ob flow sheet for FH and FHR, ,                          Physical Examination: General appearance - alert, well appearing, and in no distress                                      Abdomen - FH not done , U 0                                                        -FHR 152                                                         soft, nontender, nondistended, no masses or organomegaly                                      Pelvic - examination not indicated                                            Questions were answered. Assessment: LROB G2P1001 @ 6547w5d   Plan:  Continued routine obstetrical care,            D./c XANAX over 2 wk taper  F/u in 1 week for US   By signing my name below, I, Sonum Patel, attest that this documentation has been prepared under the direction and in the presence of Tilda BurrowJohn V Addylynn Balin, MD. Electronically Signed: Sonum Patel, Neurosurgeoncribe. 04/20/16. 11:42 AM.  I personally performed the services described in this documentation, which was SCRIBED in my presence. The recorded information has been reviewed and considered accurate. It has been edited as necessary during review. Tilda BurrowFERGUSON,Navjot Pilgrim V, MD

## 2016-04-22 ENCOUNTER — Other Ambulatory Visit: Payer: Self-pay | Admitting: Obstetrics and Gynecology

## 2016-04-22 DIAGNOSIS — Z363 Encounter for antenatal screening for malformations: Secondary | ICD-10-CM

## 2016-04-27 ENCOUNTER — Other Ambulatory Visit: Payer: Medicaid Other

## 2016-04-28 ENCOUNTER — Other Ambulatory Visit: Payer: Medicaid Other

## 2016-05-01 ENCOUNTER — Telehealth: Payer: Self-pay | Admitting: *Deleted

## 2016-05-01 ENCOUNTER — Other Ambulatory Visit: Payer: Self-pay | Admitting: Obstetrics and Gynecology

## 2016-05-01 DIAGNOSIS — Z363 Encounter for antenatal screening for malformations: Secondary | ICD-10-CM

## 2016-05-01 NOTE — Telephone Encounter (Signed)
Pt c/o lower right side extending to back, no vaginal bleeding, +FM. Pt states she has been sick for the past few days unable to eat or drink much but has been able to eat/drink more today. Pt advised to push as much fluids as possible, take Tylenol. If no improvement to call our office back, could be round ligament pain. Pt verbalized understanding.

## 2016-05-04 ENCOUNTER — Other Ambulatory Visit: Payer: Medicaid Other

## 2016-05-04 ENCOUNTER — Encounter: Payer: Self-pay | Admitting: Obstetrics & Gynecology

## 2016-05-15 ENCOUNTER — Encounter: Payer: Self-pay | Admitting: Women's Health

## 2016-05-15 ENCOUNTER — Ambulatory Visit (INDEPENDENT_AMBULATORY_CARE_PROVIDER_SITE_OTHER): Payer: Medicaid Other

## 2016-05-15 ENCOUNTER — Ambulatory Visit (INDEPENDENT_AMBULATORY_CARE_PROVIDER_SITE_OTHER): Payer: Medicaid Other | Admitting: Women's Health

## 2016-05-15 VITALS — BP 90/52 | HR 84 | Wt 131.0 lb

## 2016-05-15 DIAGNOSIS — Z3A24 24 weeks gestation of pregnancy: Secondary | ICD-10-CM | POA: Diagnosis not present

## 2016-05-15 DIAGNOSIS — Z6791 Unspecified blood type, Rh negative: Secondary | ICD-10-CM

## 2016-05-15 DIAGNOSIS — O09899 Supervision of other high risk pregnancies, unspecified trimester: Secondary | ICD-10-CM | POA: Diagnosis not present

## 2016-05-15 DIAGNOSIS — Z1389 Encounter for screening for other disorder: Secondary | ICD-10-CM | POA: Diagnosis not present

## 2016-05-15 DIAGNOSIS — Z331 Pregnant state, incidental: Secondary | ICD-10-CM | POA: Diagnosis not present

## 2016-05-15 DIAGNOSIS — F1910 Other psychoactive substance abuse, uncomplicated: Secondary | ICD-10-CM

## 2016-05-15 DIAGNOSIS — O26899 Other specified pregnancy related conditions, unspecified trimester: Secondary | ICD-10-CM

## 2016-05-15 DIAGNOSIS — E063 Autoimmune thyroiditis: Secondary | ICD-10-CM | POA: Diagnosis not present

## 2016-05-15 DIAGNOSIS — Z3402 Encounter for supervision of normal first pregnancy, second trimester: Secondary | ICD-10-CM

## 2016-05-15 DIAGNOSIS — Z363 Encounter for antenatal screening for malformations: Secondary | ICD-10-CM | POA: Diagnosis not present

## 2016-05-15 DIAGNOSIS — O99342 Other mental disorders complicating pregnancy, second trimester: Secondary | ICD-10-CM | POA: Diagnosis not present

## 2016-05-15 DIAGNOSIS — O99322 Drug use complicating pregnancy, second trimester: Secondary | ICD-10-CM

## 2016-05-15 DIAGNOSIS — Z3482 Encounter for supervision of other normal pregnancy, second trimester: Secondary | ICD-10-CM

## 2016-05-15 LAB — POCT URINALYSIS DIPSTICK
GLUCOSE UA: NEGATIVE
Ketones, UA: NEGATIVE
LEUKOCYTES UA: NEGATIVE
NITRITE UA: NEGATIVE
Protein, UA: NEGATIVE
RBC UA: NEGATIVE

## 2016-05-15 NOTE — Progress Notes (Signed)
[place] 23+2wks,cephalic,cx 3.5 cm,ant pl gr 0,svp of fluid 4.2 cm,normal ov's bilat,efw 630 g 53%,ananatomy complete,no obvious abnormalities seen

## 2016-05-15 NOTE — Patient Instructions (Addendum)
[place] Pediatricians/Family Doctors:  [facility] [phone]            [facility] [phone]                 [facility] [phone] (usually not accepting new patients unless you have family there already, you are always welcome to call and ask)            [facility]:   [facility]: [phone]  [facility]: [phone]   You will have your sugar test next visit.  Please do not eat or drink anything after midnight the night before you come, not even water.  You will be here for at least two hours.     Call the office [phone]) or go to [facility] if:  You begin to have strong, frequent contractions  Your water breaks.  Sometimes it is a big gush of fluid, sometimes it is just a trickle that keeps getting your panties wet or running down your legs  You have vaginal bleeding.  It is normal to have a small amount of spotting if your cervix was checked.   You don't feel your baby moving like normal.  If you don't, get you something to eat and drink and lay down and focus on feeling your baby move.   If your baby is still not moving like normal, you should call the office or go to [facility].  Second Trimester of Pregnancy The second trimester is from week 13 through week 28, months 4 through 6. The second trimester is often a time when you feel your best. Your body has also adjusted to being pregnant, and you begin to feel better physically. Usually, morning sickness has lessened or quit completely, you may have more energy, and you may have an increase in appetite. The second trimester is also a time when the fetus is growing rapidly. At the end of the sixth month, the fetus is about 9 inches long and weighs about 1 pounds. You will likely begin to feel the baby move (quickening) between 18 and 20 weeks of the pregnancy. BODY CHANGES Your body goes through many changes  during pregnancy. The changes vary from woman to woman.   Your weight will continue to increase. You will notice your lower abdomen bulging out.  You may begin to get stretch marks on your hips, abdomen, and breasts.  You may develop headaches that can be relieved by medicines approved by your health care provider.  You may urinate more often because the fetus is pressing on your bladder.  You may develop or continue to have heartburn as a result of your pregnancy.  You may develop constipation because certain hormones are causing the muscles that push waste through your intestines to slow down.  You may develop hemorrhoids or swollen, bulging veins (varicose veins).  You may have back pain because of the weight gain and pregnancy hormones relaxing your joints between the bones in your pelvis and as a result of a shift in weight and the muscles that support your balance.  Your breasts will continue to grow and be tender.  Your gums may bleed and may be sensitive to brushing and flossing.  Dark spots or blotches (chloasma, mask of pregnancy) may develop on your face. This will likely fade after the baby is born.  A dark line from your belly button to the pubic area (linea nigra) may appear. This will likely fade after the baby is  born.  Bonita QuinYou may have changes in your hair. These can include thickening of your hair, rapid growth, and changes in texture. Some women also have hair loss during or after pregnancy, or hair that feels dry or thin. Your hair will most likely return to normal after your baby is born. WHAT TO EXPECT AT YOUR PRENATAL VISITS During a routine prenatal visit:  You will be weighed to make sure you and the fetus are growing normally.  Your blood pressure will be taken.  Your abdomen will be measured to track your baby's growth.  The fetal heartbeat will be listened to.  Any test results from the previous visit will be discussed. Your health care provider may ask  you:  How you are feeling.  If you are feeling the baby move.  If you have had any abnormal symptoms, such as leaking fluid, bleeding, severe headaches, or abdominal cramping.  If you have any questions. Other tests that may be performed during your second trimester include:  Blood tests that check for:  Low iron levels (anemia).  Gestational diabetes (between 24 and 28 weeks).  Rh antibodies.  Urine tests to check for infections, diabetes, or protein in the urine.  An ultrasound to confirm the proper growth and development of the baby.  An amniocentesis to check for possible genetic problems.  Fetal screens for spina bifida and Down syndrome. HOME CARE INSTRUCTIONS   Avoid all smoking, herbs, alcohol, and unprescribed drugs. These chemicals affect the formation and growth of the baby.  Follow your health care provider's instructions regarding medicine use. There are medicines that are either safe or unsafe to take during pregnancy.  Exercise only as directed by your health care provider. Experiencing uterine cramps is a good sign to stop exercising.  Continue to eat regular, healthy meals.  Wear a good support bra for breast tenderness.  Do not use hot tubs, steam rooms, or saunas.  Wear your seat belt at all times when driving.  Avoid raw meat, uncooked cheese, cat litter boxes, and soil used by cats. These carry germs that can cause birth defects in the baby.  Take your prenatal vitamins.  Try taking a stool softener (if your health care provider approves) if you develop constipation. Eat more high-fiber foods, such as fresh vegetables or fruit and whole grains. Drink plenty of fluids to keep your urine clear or pale yellow.  Take warm sitz baths to soothe any pain or discomfort caused by hemorrhoids. Use hemorrhoid cream if your health care provider approves.  If you develop varicose veins, wear support hose. Elevate your feet for 15 minutes, 3-4 times a day.  Limit salt in your diet.  Avoid heavy lifting, wear low heel shoes, and practice good posture.  Rest with your legs elevated if you have leg cramps or low back pain.  Visit your dentist if you have not gone yet during your pregnancy. Use a soft toothbrush to brush your teeth and be gentle when you floss.  A sexual relationship may be continued unless your health care provider directs you otherwise.  Continue to go to all your prenatal visits as directed by your health care provider. SEEK MEDICAL CARE IF:   You have dizziness.  You have mild pelvic cramps, pelvic pressure, or nagging pain in the abdominal area.  You have persistent nausea, vomiting, or diarrhea.  You have a bad smelling vaginal discharge.  You have pain with urination. SEEK IMMEDIATE MEDICAL CARE IF:   You have a  fever.  You are leaking fluid from your vagina.  You have spotting or bleeding from your vagina.  You have severe abdominal cramping or pain.  You have rapid weight gain or loss.  You have shortness of breath with chest pain.  You notice sudden or extreme swelling of your face, hands, ankles, feet, or legs.  You have not felt your baby move in over an hour.  You have severe headaches that do not go away with medicine.  You have vision changes. Document Released: 05/12/2001 Document Revised: 05/23/2013 Document Reviewed: 07/19/2012 [facility] Patient Information 2015 [place], [place]. This information is not intended to replace advice given to you by your health care provider. Make sure you discuss any questions you have with your health care provider.

## 2016-05-15 NOTE — Progress Notes (Signed)
Low-risk OB appointment G2P1001 4072w2d Estimated Date of Delivery: 09/09/16 BP (!) 90/52   Pulse 84   Wt 131 lb (59.4 kg)   LMP  (LMP Unknown)   BMI 21.14 kg/m   BP, weight, and urine reviewed.  Refer to obstetrical flow sheet for FH & FHR.  Reports good fm.  Denies regular uc's, lof, vb, or uti s/s. No xanax since last visit, no cocaine since early pregnancy, last heroine 1 month ago- quit all- now [facility] for [facility]one 40[facility]  daily. Never got IT screening labs done, too late now for AFP.  Reviewed ptl s/s, fm. Plan:  Continue routine obstetrical care  F/U in 4wks for OB appointment and pn2 Will get thyroid panel today d/t h/o autoimmune thyroiditis- was to get w/ IT screening but never went to lab Repeat UDS today

## 2016-05-16 LAB — PMP SCREEN PROFILE (10S), URINE
AMPHETAMINE SCRN UR: POSITIVE ng/mL
BARBITURATE SCRN UR: NEGATIVE ng/mL
Benzodiazepine Screen, Urine: NEGATIVE ng/mL
CANNABINOIDS UR QL SCN: NEGATIVE ng/mL
COCAINE(METAB.) SCREEN, URINE: NEGATIVE ng/mL
Creatinine(Crt), U: 113.2 mg/dL (ref 20.0–300.0)
METHADONE SCREEN, URINE: POSITIVE ng/mL
OPIATE SCRN UR: POSITIVE ng/mL
Oxycodone+Oxymorphone Ur Ql Scn: POSITIVE ng/mL
PCP SCRN UR: NEGATIVE ng/mL
PROPOXYPHENE SCREEN: NEGATIVE ng/mL
Ph of Urine: 7.8 (ref 4.5–8.9)

## 2016-05-16 LAB — THYROID PANEL WITH TSH
FREE THYROXINE INDEX: 1.7 (ref 1.2–4.9)
T3 UPTAKE RATIO: 15 % — AB (ref 24–39)
T4, Total: 11.1 ug/dL (ref 4.5–12.0)
TSH: 1.4 u[IU]/mL (ref 0.450–4.500)

## 2016-06-01 NOTE — L&D Delivery Note (Signed)
Patient is 31 y.o. G2P1001 [redacted]w[redacted]d admitted for active labor, hx of methadone use, benzos and cocaine earlier in pregnancy and autoimmune thyroiditis   Delivery Note At 7:02 PM a viable female was delivered LOA over intact perineum, anterior shoulder delivered easily.  APGAR: 9, 9; weight pending. Placenta status: intact, spontaneously delivered.  Cord: 3 vessel with the following complications: none    Anesthesia:  None Episiotomy: None Lacerations:  None Suture Repair: n/i Est. Blood Loss (mL):   Mom to postpartum.  Baby to Couplet care / Skin to Skin.  Durenda Hurt 09/10/2016, 7:22 PM

## 2016-06-12 ENCOUNTER — Encounter: Payer: Medicaid Other | Admitting: Obstetrics and Gynecology

## 2016-06-12 ENCOUNTER — Other Ambulatory Visit: Payer: Medicaid Other

## 2016-06-12 ENCOUNTER — Encounter: Payer: Self-pay | Admitting: *Deleted

## 2016-06-22 ENCOUNTER — Telehealth: Payer: Self-pay | Admitting: *Deleted

## 2016-06-22 NOTE — Telephone Encounter (Signed)
Patient called with concerns of not getting rhogam or PN2 labs. Informed patient she needed to reschedule her appointment to be seen and to have those labs drawn. Pt transferred to appointments

## 2016-06-24 ENCOUNTER — Ambulatory Visit (INDEPENDENT_AMBULATORY_CARE_PROVIDER_SITE_OTHER): Payer: Medicaid Other | Admitting: Women's Health

## 2016-06-24 ENCOUNTER — Encounter: Payer: Self-pay | Admitting: Women's Health

## 2016-06-24 ENCOUNTER — Other Ambulatory Visit: Payer: Medicaid Other

## 2016-06-24 VITALS — BP 97/64 | HR 79 | Wt 134.0 lb

## 2016-06-24 DIAGNOSIS — Z3483 Encounter for supervision of other normal pregnancy, third trimester: Secondary | ICD-10-CM

## 2016-06-24 DIAGNOSIS — O99343 Other mental disorders complicating pregnancy, third trimester: Secondary | ICD-10-CM

## 2016-06-24 DIAGNOSIS — Z331 Pregnant state, incidental: Secondary | ICD-10-CM

## 2016-06-24 DIAGNOSIS — Z1389 Encounter for screening for other disorder: Secondary | ICD-10-CM | POA: Diagnosis not present

## 2016-06-24 DIAGNOSIS — O99323 Drug use complicating pregnancy, third trimester: Secondary | ICD-10-CM | POA: Diagnosis not present

## 2016-06-24 DIAGNOSIS — Z3A29 29 weeks gestation of pregnancy: Secondary | ICD-10-CM | POA: Diagnosis not present

## 2016-06-24 DIAGNOSIS — F1910 Other psychoactive substance abuse, uncomplicated: Secondary | ICD-10-CM

## 2016-06-24 LAB — POCT URINALYSIS DIPSTICK
Glucose, UA: NEGATIVE
KETONES UA: NEGATIVE
LEUKOCYTES UA: NEGATIVE
Nitrite, UA: NEGATIVE
PROTEIN UA: NEGATIVE
RBC UA: NEGATIVE

## 2016-06-24 NOTE — Patient Instructions (Addendum)
Call the office [phone]) or go to [facility] if:  You begin to have strong, frequent contractions  Your water breaks.  Sometimes it is a big gush of fluid, sometimes it is just a trickle that keeps getting your panties wet or running down your legs  You have vaginal bleeding.  It is normal to have a small amount of spotting if your cervix was checked.   You don't feel your baby moving like normal.  If you don't, get you something to eat and drink and lay down and focus on feeling your baby move.  You should feel at least 10 movements in 2 hours.  If you don't, you should call the office or go to [facility].    Tdap Vaccine  It is recommended that you get the Tdap vaccine during the third trimester of EACH pregnancy to help protect your baby from getting pertussis (whooping cough)  27-36 weeks is the BEST time to do this so that you can pass the protection on to your baby. During pregnancy is better than after pregnancy, but if you are unable to get it during pregnancy it will be offered at the hospital.   You can get this vaccine at the health department or your family doctor  Everyone who will be around your baby should also be up-to-date on their vaccines. Adults (who are not pregnant) only need 1 dose of Tdap during adulthood.   You have a viral infection that will resolve on its own over time.  Symptoms typically last 3-7 days but can stretch out to 2-3 weeks.  Unfortunately, antibiotics are not helpful for viral infections.  Humidifier and saline nasal spray for nasal congestion  Regular robitussin, cough drops for cough  Warm salt water gargles for sore throat  Mucinex with lots of water to help you cough up the mucous in your chest if needed  Drink plenty of fluids and stay hydrated!  Wash your hands frequently.  Call if you are not improving by 7-10 days.     Third Trimester of Pregnancy The third trimester is from week 29 through week 42, months 7 through  9. The third trimester is a time when the fetus is growing rapidly. At the end of the ninth month, the fetus is about 20 inches in length and weighs 6-10 pounds.  BODY CHANGES Your body goes through many changes during pregnancy. The changes vary from woman to woman.   Your weight will continue to increase. You can expect to gain 25-35 pounds (11-16 kg) by the end of the pregnancy.  You may begin to get stretch marks on your hips, abdomen, and breasts.  You may urinate more often because the fetus is moving lower into your pelvis and pressing on your bladder.  You may develop or continue to have heartburn as a result of your pregnancy.  You may develop constipation because certain hormones are causing the muscles that push waste through your intestines to slow down.  You may develop hemorrhoids or swollen, bulging veins (varicose veins).  You may have pelvic pain because of the weight gain and pregnancy hormones relaxing your joints between the bones in your pelvis. Backaches may result from overexertion of the muscles supporting your posture.  You may have changes in your hair. These can include thickening of your hair, rapid growth, and changes in texture. Some women also have hair loss during or after pregnancy, or hair that feels dry or thin. Your hair will most likely  return to normal after your baby is born.  Your breasts will continue to grow and be tender. A yellow discharge may leak from your breasts called colostrum.  Your belly button may stick out.  You may feel short of breath because of your expanding uterus.  You may notice the fetus "dropping," or moving lower in your abdomen.  You may have a bloody mucus discharge. This usually occurs a few days to a week before labor begins.  Your cervix becomes thin and soft (effaced) near your due date. WHAT TO EXPECT AT YOUR PRENATAL EXAMS  You will have prenatal exams every 2 weeks until week 36. Then, you will have weekly  prenatal exams. During a routine prenatal visit:  You will be weighed to make sure you and the fetus are growing normally.  Your blood pressure is taken.  Your abdomen will be measured to track your baby's growth.  The fetal heartbeat will be listened to.  Any test results from the previous visit will be discussed.  You may have a cervical check near your due date to see if you have effaced. At around 36 weeks, your caregiver will check your cervix. At the same time, your caregiver will also perform a test on the secretions of the vaginal tissue. This test is to determine if a type of bacteria, Group B streptococcus, is present. Your caregiver will explain this further. Your caregiver may ask you:  What your birth plan is.  How you are feeling.  If you are feeling the baby move.  If you have had any abnormal symptoms, such as leaking fluid, bleeding, severe headaches, or abdominal cramping.  If you have any questions. Other tests or screenings that may be performed during your third trimester include:  Blood tests that check for low iron levels (anemia).  Fetal testing to check the health, activity level, and growth of the fetus. Testing is done if you have certain medical conditions or if there are problems during the pregnancy. FALSE LABOR You may feel small, irregular contractions that eventually go away. These are called Braxton Hicks contractions, or false labor. Contractions may last for hours, days, or even weeks before true labor sets in. If contractions come at regular intervals, intensify, or become painful, it is best to be seen by your caregiver.  SIGNS OF LABOR   Menstrual-like cramps.  Contractions that are 5 minutes apart or less.  Contractions that start on the top of the uterus and spread down to the lower abdomen and back.  A sense of increased pelvic pressure or back pain.  A watery or bloody mucus discharge that comes from the vagina. If you have any of  these signs before the 37th week of pregnancy, call your caregiver right away. You need to go to the hospital to get checked immediately. HOME CARE INSTRUCTIONS   Avoid all smoking, herbs, alcohol, and unprescribed drugs. These chemicals affect the formation and growth of the baby.  Follow your caregiver's instructions regarding medicine use. There are medicines that are either safe or unsafe to take during pregnancy.  Exercise only as directed by your caregiver. Experiencing uterine cramps is a good sign to stop exercising.  Continue to eat regular, healthy meals.  Wear a good support bra for breast tenderness.  Do not use hot tubs, steam rooms, or saunas.  Wear your seat belt at all times when driving.  Avoid raw meat, uncooked cheese, cat litter boxes, and soil used by cats. These carry germs  that can cause birth defects in the baby.  Take your prenatal vitamins.  Try taking a stool softener (if your caregiver approves) if you develop constipation. Eat more high-fiber foods, such as fresh vegetables or fruit and whole grains. Drink plenty of fluids to keep your urine clear or pale yellow.  Take warm sitz baths to soothe any pain or discomfort caused by hemorrhoids. Use hemorrhoid cream if your caregiver approves.  If you develop varicose veins, wear support hose. Elevate your feet for 15 minutes, 3-4 times a day. Limit salt in your diet.  Avoid heavy lifting, wear low heal shoes, and practice good posture.  Rest a lot with your legs elevated if you have leg cramps or low back pain.  Visit your dentist if you have not gone during your pregnancy. Use a soft toothbrush to brush your teeth and be gentle when you floss.  A sexual relationship may be continued unless your caregiver directs you otherwise.  Do not travel far distances unless it is absolutely necessary and only with the approval of your caregiver.  Take prenatal classes to understand, practice, and ask questions about  the labor and delivery.  Make a trial run to the hospital.  Pack your hospital bag.  Prepare the baby's nursery.  Continue to go to all your prenatal visits as directed by your caregiver. SEEK MEDICAL CARE IF:  You are unsure if you are in labor or if your water has broken.  You have dizziness.  You have mild pelvic cramps, pelvic pressure, or nagging pain in your abdominal area.  You have persistent nausea, vomiting, or diarrhea.  You have a bad smelling vaginal discharge.  You have pain with urination. SEEK IMMEDIATE MEDICAL CARE IF:   You have a fever.  You are leaking fluid from your vagina.  You have spotting or bleeding from your vagina.  You have severe abdominal cramping or pain.  You have rapid weight loss or gain.  You have shortness of breath with chest pain.  You notice sudden or extreme swelling of your face, hands, ankles, feet, or legs.  You have not felt your baby move in over an hour.  You have severe headaches that do not go away with medicine.  You have vision changes. Document Released: 05/12/2001 Document Revised: 05/23/2013 Document Reviewed: 07/19/2012 [facility] Patient Information 2015 [place], [place]. This information is not intended to replace advice given to you by your health care provider. Make sure you discuss any questions you have with your health care provider.  Safe Medications in Pregnancy   Acne: Benzoyl Peroxide Salicylic Acid  Backache/Headache: Tylenol: 2 regular strength every 4 hours OR              2 Extra strength every 6 hours  Colds/Coughs/Allergies: Benadryl (alcohol free) 25 mg every 6 hours as needed Breath right strips Claritin Cepacol throat lozenges Chloraseptic throat spray Cold-Eeze- up to three times per day Cough drops, alcohol free Flonase (by prescription only) Guaifenesin Mucinex Robitussin DM (plain only, alcohol free) Saline nasal spray/drops Sudafed (pseudoephedrine) & Actifed ** use only  after [redacted] weeks gestation and if you do not have high blood pressure Tylenol Vicks Vaporub Zinc lozenges Zyrtec   Constipation: Colace Ducolax suppositories Fleet enema Glycerin suppositories Metamucil Milk of magnesia Miralax Senokot Smooth move tea  Diarrhea: Kaopectate Imodium A-D  *NO pepto Bismol  Hemorrhoids: Anusol Anusol HC Preparation H Tucks  Indigestion: Tums Maalox Mylanta Zantac  Pepcid  Insomnia: Benadryl (alcohol free) 25mg  every  6 hours as needed Tylenol PM Unisom, no Gelcaps  Leg Cramps: Tums MagGel  Nausea/Vomiting:  Bonine Dramamine Emetrol Ginger extract Sea bands Meclizine  Nausea medication to take during pregnancy:  Unisom (doxylamine succinate 25 mg tablets) Take one tablet daily at bedtime. If symptoms are not adequately controlled, the dose can be increased to a maximum recommended dose of two tablets daily (1/2 tablet in the morning, 1/2 tablet mid-afternoon and one at bedtime). Vitamin B6 100mg  tablets. Take one tablet twice a day (up to 200 mg per day).  Skin Rashes: Aveeno products Benadryl cream or 25mg  every 6 hours as needed Calamine Lotion 1% Hydrocortisone cream  Yeast infection: Gyne-lotrimin 7 Monistat 7   **If taking multiple medications, please check labels to avoid duplicating the same active ingredients **take medication as directed on the label ** Do not exceed 4000 mg of tylenol in 24 hours **Do not take medications that contain aspirin or ibuprofen

## 2016-06-24 NOTE — Progress Notes (Signed)
Low-risk OB appointment G2P1001 1565w0d Estimated Date of Delivery: 09/09/16 BP 97/64   Pulse 79   Wt 134 lb (60.8 kg)   LMP  (LMP Unknown)   BMI 21.63 kg/m   BP, weight, and urine reviewed.  Refer to obstetrical flow sheet for FH & FHR.  Reports good fm.  Denies regular uc's, lof, vb, or uti s/s. Cold sx/congestion, no fever- gave printed relief measures, let us know if worsening/not improving.  Discussed last UDS + for amphetamines, opiates, oxycodone/oxymorphone (not sure which), and methadone when she reported at that visit only being on methadone. Unsure of where the amphetamines came from, but states at the time she was still taking opiates, as well as heroine every few days. Reports no current opiates, she is still using heroine every few days and is currently on [facility], sees 'Dr. Tresa EndoKelly' q mth- sees her again next Friday and wants to discuss going up on methadone dosage so she can come off of heroine.  Discussed the importance of stopping the heroine and all other substances other than methadone. States she does want to stop, understands her baby may be taken from her if continues. Discussed NAS counseling/tour of NICU, is interested.  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 Talked to [place] [facility] today to get this initiated.  Reviewed ptl s/s, fkc. Recommended Tdap at HD/PCP per CDC guidelines.  Plan:  Continue routine obstetrical care  F/U in 1wk for OB appointment w/ MD and efw/afi u/s for polysubstance abuse during pregnancy, and Rhogam and flu shot (holding off today d/t uri sx) Repeat UDS today After visit: contacted NAS team at NICU, they will contact pt to get consult scheduled

## 2016-06-25 ENCOUNTER — Other Ambulatory Visit: Payer: Self-pay | Admitting: Women's Health

## 2016-06-25 ENCOUNTER — Telehealth: Payer: Self-pay | Admitting: *Deleted

## 2016-06-25 LAB — CBC
HEMOGLOBIN: 10.7 g/dL — AB (ref 11.1–15.9)
Hematocrit: 31 % — ABNORMAL LOW (ref 34.0–46.6)
MCH: 30.9 pg (ref 26.6–33.0)
MCHC: 34.5 g/dL (ref 31.5–35.7)
MCV: 90 fL (ref 79–97)
Platelets: 301 10*3/uL (ref 150–379)
RBC: 3.46 x10E6/uL — AB (ref 3.77–5.28)
RDW: 13.7 % (ref 12.3–15.4)
WBC: 7.4 10*3/uL (ref 3.4–10.8)

## 2016-06-25 LAB — PMP SCREEN PROFILE (10S), URINE
Amphetamine Screen, Ur: POSITIVE ng/mL
BENZODIAZEPINE SCREEN, URINE: NEGATIVE ng/mL
Barbiturate Screen, Ur: NEGATIVE ng/mL
Cannabinoids Ur Ql Scn: NEGATIVE ng/mL
Cocaine(Metab.)Screen, Urine: NEGATIVE ng/mL
Creatinine(Crt), U: 79.8 mg/dL (ref 20.0–300.0)
Methadone Scn, Ur: POSITIVE ng/mL
OPIATE SCRN UR: POSITIVE ng/mL
OXYCODONE+OXYMORPHONE UR QL SCN: NEGATIVE ng/mL
PCP Scrn, Ur: NEGATIVE ng/mL
Ph of Urine: 7.5 (ref 4.5–8.9)
Propoxyphene, Screen: NEGATIVE ng/mL

## 2016-06-25 LAB — GLUCOSE TOLERANCE, 2 HOURS W/ 1HR
GLUCOSE, 1 HOUR: 82 mg/dL (ref 65–179)
GLUCOSE, FASTING: 83 mg/dL (ref 65–91)
Glucose, 2 hour: 87 mg/dL (ref 65–152)

## 2016-06-25 LAB — HIV ANTIBODY (ROUTINE TESTING W REFLEX): HIV SCREEN 4TH GENERATION: NONREACTIVE

## 2016-06-25 LAB — RPR: RPR: NONREACTIVE

## 2016-06-25 LAB — ANTIBODY SCREEN: ANTIBODY SCREEN: NEGATIVE

## 2016-06-25 MED ORDER — FERROUS SULFATE 325 (65 FE) MG PO TABS: 325.0000 mg | ORAL_TABLET | Freq: Two times a day (BID) | ORAL | 3 refills | Status: DC

## 2016-06-26 ENCOUNTER — Encounter (HOSPITAL_COMMUNITY): Payer: Self-pay

## 2016-06-26 NOTE — Progress Notes (Signed)
Attempted to call to set up NICU NAS tour for patient. Phone number [phone] did not have a voicemail set up and phone number [phone] had a voicemail that was full. Please call [phone] to attempt to reschedule tour if you are able to get active phone numbers for the patient.  Thank you.

## 2016-06-29 NOTE — Telephone Encounter (Signed)
Pt informed of anemia, states she has had problems with it her whole life.  Pt will take PNV and Fe and try to increase iron in diet.

## 2016-07-02 ENCOUNTER — Encounter: Payer: Medicaid Other | Admitting: Obstetrics & Gynecology

## 2016-07-02 ENCOUNTER — Other Ambulatory Visit: Payer: Medicaid Other

## 2016-07-08 ENCOUNTER — Ambulatory Visit (INDEPENDENT_AMBULATORY_CARE_PROVIDER_SITE_OTHER): Payer: Medicaid Other

## 2016-07-08 ENCOUNTER — Ambulatory Visit (INDEPENDENT_AMBULATORY_CARE_PROVIDER_SITE_OTHER): Payer: Medicaid Other | Admitting: Advanced Practice Midwife

## 2016-07-08 ENCOUNTER — Encounter: Payer: Self-pay | Admitting: Advanced Practice Midwife

## 2016-07-08 DIAGNOSIS — O360130 Maternal care for anti-D [Rh] antibodies, third trimester, not applicable or unspecified: Secondary | ICD-10-CM

## 2016-07-08 DIAGNOSIS — F1120 Opioid dependence, uncomplicated: Secondary | ICD-10-CM | POA: Diagnosis not present

## 2016-07-08 DIAGNOSIS — Z331 Pregnant state, incidental: Secondary | ICD-10-CM

## 2016-07-08 DIAGNOSIS — Z3403 Encounter for supervision of normal first pregnancy, third trimester: Secondary | ICD-10-CM

## 2016-07-08 DIAGNOSIS — O99323 Drug use complicating pregnancy, third trimester: Secondary | ICD-10-CM

## 2016-07-08 DIAGNOSIS — O26899 Other specified pregnancy related conditions, unspecified trimester: Secondary | ICD-10-CM

## 2016-07-08 DIAGNOSIS — Z3A31 31 weeks gestation of pregnancy: Secondary | ICD-10-CM | POA: Diagnosis not present

## 2016-07-08 DIAGNOSIS — Z1389 Encounter for screening for other disorder: Secondary | ICD-10-CM | POA: Diagnosis not present

## 2016-07-08 DIAGNOSIS — Z23 Encounter for immunization: Secondary | ICD-10-CM | POA: Diagnosis not present

## 2016-07-08 DIAGNOSIS — Z3483 Encounter for supervision of other normal pregnancy, third trimester: Secondary | ICD-10-CM

## 2016-07-08 DIAGNOSIS — F1910 Other psychoactive substance abuse, uncomplicated: Secondary | ICD-10-CM

## 2016-07-08 DIAGNOSIS — Z6791 Unspecified blood type, Rh negative: Secondary | ICD-10-CM

## 2016-07-08 LAB — POCT URINALYSIS DIPSTICK
Blood, UA: NEGATIVE
Glucose, UA: NEGATIVE
Ketones, UA: NEGATIVE
LEUKOCYTES UA: NEGATIVE
Nitrite, UA: NEGATIVE

## 2016-07-08 MED ORDER — RHO D IMMUNE GLOBULIN 1500 UNIT/2ML IJ SOSY
300.0000 ug | PREFILLED_SYRINGE | Freq: Once | INTRAMUSCULAR | Status: AC
  Administered 2016-07-08: 300 ug via INTRAMUSCULAR

## 2016-07-08 NOTE — Progress Notes (Signed)
G2P1001 5759w0d Estimated Date of Delivery: 09/09/16  Blood pressure (!) 92/58, pulse 70, weight 138 lb (62.6 kg).   BP weight and urine results all reviewed and noted.  Please refer to the obstetrical flow sheet for the fundal height and fetal heart rate documentation: Mom had "massive stroke" last week ande isn't doing well   US 31 wks,cephalic,ant pl gr 1,normal ov's bilat,fhr 132 bpm,afi 10 cm,EFW 1669 g 48 %  Patient reports good fetal movement, denies any bleeding and no rupture of membranes symptoms or regular contractions. Patient is without complaints. Saw her Methadone MD Friday and upped her methadone to 65mg .  Last used heroin Monday.  Discussed inpt treatment--pt not ready.  States that husband does not use drugs, knows that she does.  Aware that CPS may not let her have custody of newborn. Has custody of other child.  .   All questions were answered.  Orders Placed This Encounter  Procedures  . Flu Vaccine QUAD 36+ mos IM  . Pain Management Screening Profile (10S)  . POCT Urinalysis Dipstick    Plan:  Continued routine obstetrical care, encouraged to think about inpt rehab  Return in about 2 weeks (around 07/22/2016) for LROB.

## 2016-07-08 NOTE — Progress Notes (Signed)
US 31 wks,cephalic,ant pl gr 1,normal ov's bilat,fhr 132 bpm,afi 10 cm,EFW 1669 g 48 %

## 2016-07-08 NOTE — Patient Instructions (Signed)
[phone] to schedule NICU tour

## 2016-07-09 LAB — PMP SCREEN PROFILE (10S), URINE
Amphetamine Screen, Ur: POSITIVE ng/mL
Barbiturate Screen, Ur: NEGATIVE ng/mL
Benzodiazepine Screen, Urine: NEGATIVE ng/mL
COCAINE(METAB.) SCREEN, URINE: NEGATIVE ng/mL
CREATININE(CRT), U: 136.6 mg/dL (ref 20.0–300.0)
Cannabinoids Ur Ql Scn: NEGATIVE ng/mL
METHADONE SCREEN, URINE: POSITIVE ng/mL
Opiate Scrn, Ur: POSITIVE ng/mL
Oxycodone+Oxymorphone Ur Ql Scn: NEGATIVE ng/mL
PCP Scrn, Ur: NEGATIVE ng/mL
PROPOXYPHENE SCREEN: NEGATIVE ng/mL
Ph of Urine: 6.5 (ref 4.5–8.9)

## 2016-07-22 ENCOUNTER — Encounter: Payer: Self-pay | Admitting: Advanced Practice Midwife

## 2016-09-04 ENCOUNTER — Emergency Department (HOSPITAL_COMMUNITY)
Admission: EM | Admit: 2016-09-04 | Discharge: 2016-09-04 | Disposition: A | Discharge status: Home / Self Care | Payer: Medicaid Other | Attending: Emergency Medicine | Admitting: Emergency Medicine

## 2016-09-04 ENCOUNTER — Encounter (HOSPITAL_COMMUNITY): Payer: Self-pay

## 2016-09-04 DIAGNOSIS — R1084 Generalized abdominal pain: Secondary | ICD-10-CM | POA: Diagnosis not present

## 2016-09-04 DIAGNOSIS — Z87891 Personal history of nicotine dependence: Secondary | ICD-10-CM | POA: Diagnosis not present

## 2016-09-04 DIAGNOSIS — Z79899 Other long term (current) drug therapy: Secondary | ICD-10-CM | POA: Insufficient documentation

## 2016-09-04 DIAGNOSIS — R112 Nausea with vomiting, unspecified: Secondary | ICD-10-CM

## 2016-09-04 DIAGNOSIS — E039 Hypothyroidism, unspecified: Secondary | ICD-10-CM | POA: Insufficient documentation

## 2016-09-04 DIAGNOSIS — O212 Late vomiting of pregnancy: Secondary | ICD-10-CM | POA: Diagnosis not present

## 2016-09-04 DIAGNOSIS — Z3A39 39 weeks gestation of pregnancy: Secondary | ICD-10-CM | POA: Diagnosis not present

## 2016-09-04 DIAGNOSIS — O26893 Other specified pregnancy related conditions, third trimester: Secondary | ICD-10-CM | POA: Diagnosis present

## 2016-09-04 LAB — URINALYSIS, ROUTINE W REFLEX MICROSCOPIC
BILIRUBIN URINE: NEGATIVE
Bacteria, UA: NONE SEEN
GLUCOSE, UA: NEGATIVE mg/dL
HGB URINE DIPSTICK: NEGATIVE
KETONES UR: NEGATIVE mg/dL
NITRITE: NEGATIVE
PROTEIN: NEGATIVE mg/dL
Specific Gravity, Urine: 1.009 (ref 1.005–1.030)
pH: 7 (ref 5.0–8.0)

## 2016-09-04 LAB — CBC WITH DIFFERENTIAL/PLATELET
Basophils Absolute: 0 10*3/uL (ref 0.0–0.1)
Basophils Relative: 0 %
EOS ABS: 0.1 10*3/uL (ref 0.0–0.7)
EOS PCT: 1 %
HCT: 36.4 % (ref 36.0–46.0)
Hemoglobin: 12.1 g/dL (ref 12.0–15.0)
LYMPHS ABS: 0.5 10*3/uL — AB (ref 0.7–4.0)
Lymphocytes Relative: 4 %
MCH: 30.3 pg (ref 26.0–34.0)
MCHC: 33.2 g/dL (ref 30.0–36.0)
MCV: 91.2 fL (ref 78.0–100.0)
Monocytes Absolute: 0.6 10*3/uL (ref 0.1–1.0)
Monocytes Relative: 4 %
Neutro Abs: 12.7 10*3/uL — ABNORMAL HIGH (ref 1.7–7.7)
Neutrophils Relative %: 91 %
PLATELETS: 211 10*3/uL (ref 150–400)
RBC: 3.99 MIL/uL (ref 3.87–5.11)
RDW: 13.9 % (ref 11.5–15.5)
WBC: 13.9 10*3/uL — AB (ref 4.0–10.5)

## 2016-09-04 LAB — COMPREHENSIVE METABOLIC PANEL
ALT: 14 U/L (ref 14–54)
ANION GAP: 7 (ref 5–15)
AST: 21 U/L (ref 15–41)
Albumin: 2.9 g/dL — ABNORMAL LOW (ref 3.5–5.0)
Alkaline Phosphatase: 211 U/L — ABNORMAL HIGH (ref 38–126)
BUN: 13 mg/dL (ref 6–20)
CHLORIDE: 102 mmol/L (ref 101–111)
CO2: 27 mmol/L (ref 22–32)
Calcium: 8.2 mg/dL — ABNORMAL LOW (ref 8.9–10.3)
Creatinine, Ser: 0.58 mg/dL (ref 0.44–1.00)
GFR calc non Af Amer: >60 mL/min (ref >60)
Glucose, Bld: 93 mg/dL (ref 65–99)
POTASSIUM: 4.3 mmol/L (ref 3.5–5.1)
SODIUM: 136 mmol/L (ref 135–145)
Total Bilirubin: 0.1 mg/dL — ABNORMAL LOW (ref 0.3–1.2)
Total Protein: 6.7 g/dL (ref 6.5–8.1)

## 2016-09-04 LAB — LIPASE, BLOOD: Lipase: 41 U/L (ref 11–51)

## 2016-09-04 MED ORDER — ONDANSETRON 4 MG PREPACK (~~LOC~~): 1.0000 | ORAL_TABLET | Freq: Three times a day (TID) | ORAL | 0 refills | Status: DC | PRN

## 2016-09-04 MED ORDER — ONDANSETRON HCL 4 MG/2ML IJ SOLN
INTRAMUSCULAR | Status: AC
  Filled 2016-09-04: qty 2

## 2016-09-04 MED ORDER — SODIUM CHLORIDE 0.9 % IV BOLUS (SEPSIS)
1000.0000 mL | Freq: Once | INTRAVENOUS | Status: AC
  Administered 2016-09-04: 1000 mL via INTRAVENOUS

## 2016-09-04 MED ORDER — ONDANSETRON HCL 4 MG/2ML IJ SOLN
4.0000 mg | Freq: Once | INTRAMUSCULAR | Status: AC
  Administered 2016-09-04: 4 mg via INTRAVENOUS
  Filled 2016-09-04: qty 2

## 2016-09-04 MED ORDER — FENTANYL CITRATE (PF) 100 MCG/2ML IJ SOLN
50.0000 ug | Freq: Once | INTRAMUSCULAR | Status: AC
Start: 2016-09-04 — End: 2016-09-04
  Administered 2016-09-04: 50 ug via INTRAVENOUS

## 2016-09-04 MED ORDER — ONDANSETRON HCL 4 MG/2ML IJ SOLN
4.0000 mg | Freq: Once | INTRAMUSCULAR | Status: AC
  Administered 2016-09-04: 4 mg via INTRAVENOUS

## 2016-09-04 MED ORDER — FENTANYL CITRATE (PF) 100 MCG/2ML IJ SOLN
INTRAMUSCULAR | Status: AC
  Filled 2016-09-04: qty 2

## 2016-09-04 NOTE — ED Provider Notes (Signed)
[facility] DE[facility] Provider Note   CSN: [id number] Arrival date & time: 09/04/16  1451     History   Chief Complaint Chief Complaint  Patient presents with  . Emesis During Pregnancy    HPI Nina Herrera is a 31 y.o. female.  HPI  31 yo G2P1 at 66.3 here with n/v/abdominal pain for the last couple hours. Had a couple episodes of vomiting earlier today but Then slept for a few hours and woke up and over the last hour and a half she has been vomiting stomach acid multiple times without any blood. She has severe abdominal pain. She does not feel contractions that she had with her previous pregnancy. She also has not had any vaginal bleeding or leakage of fluid. She still feels the baby moving. She had decreased urine output but no changes in it otherwise. Had a history of hyperemesis but that had resolved.   Past Medical History:  Diagnosis Date  . Hypothyroidism    age 72,     Patient Active Problem List   Diagnosis Date Noted  . Polysubstance abuse 02/18/2016  . Rh negative state in antepartum period 02/18/2016  . Supervision of normal pregnancy 02/12/2016  . 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 Opioid dependence (HCC) 02/12/2016  . Hypothyroidism   . GAD (generalized anxiety disorder) 09/02/2015  . Irregular menses 06/30/2011  . Anxiety 06/30/2011  . Hair loss 06/30/2011  . Brittle nails 06/30/2011  . Autoimmune thyroiditis 06/30/2011  . Depression 06/30/2011    History reviewed. No pertinent surgical history.  OB History    Gravida Para Term Preterm AB Living   SAB TAB Ectopic Multiple Live Births           1       Home Medications    Prior to Admission medications   Medication Sig Start Date End Date Taking? Authorizing Provider  ferrous sulfate 325 (65 FE) MG tablet Take 1 tablet (325 mg total) by mouth 2 (two) times daily with a meal. 06/25/16  Yes Cheral Marker, CNM  methadone (DOLOPHINE) 10 MG tablet Take 75 mg by mouth daily.    Yes  Historical Provider, MD  Prenatal Multivit-Min-Fe-FA (PRENATAL VITAMINS PO) Take 1 tablet by mouth daily.   Yes Historical Provider, MD  ondansetron (ZOFRAN) 4 mg TABS tablet Take 4 tablets by mouth every 8 (eight) hours as needed. 09/04/16   Marily Memos, MD    Family History Family History  Problem Relation Age of Onset  . Stroke Mother     Social History Social History  Substance Use Topics  . Smoking status: Former Smoker    Packs/day: 0.25    Years: 13.00    Types: Cigarettes  . Smokeless tobacco: Never Used  . Alcohol use No     Allergies   Patient has no known allergies.   Review of Systems Review of Systems  All other systems reviewed and are negative.    Physical Exam Updated Vital Signs BP 121/78 (BP Location: Right Arm)   Pulse 77   Temp 97.9 F (36.6 C) (Oral)   Resp 15   Ht  (1.702 m)   Wt 140 lb (63.5 kg)   LMP  (LMP Unknown)   SpO2 98%   BMI 21.93 kg/m   Physical Exam  Constitutional: She is oriented to person, place, and time. She appears well-developed and well-nourished.  HENT:  Head: Normocephalic and atraumatic.  Eyes: Conjunctivae are normal.  Neck: Normal range of motion.  Cardiovascular: Normal rate and regular rhythm.   Pulmonary/Chest: Effort normal. No stridor. No respiratory distress.  Abdominal: She exhibits no distension.  Musculoskeletal: Normal range of motion. She exhibits no edema or deformity.  Neurological: She is alert and oriented to person, place, and time. No cranial nerve deficit. Coordination normal.  Skin: Skin is warm and dry.  Nursing note and vitals reviewed.    ED Treatments / Results  Labs (all labs ordered are listed, but only abnormal results are displayed) Labs Reviewed  CBC WITH DIFFERENTIAL/PLATELET - Abnormal; Notable for the following:       Result Value   WBC 13.9 (*)    Neutro Abs 12.7 (*)    Lymphs Abs 0.5 (*)    All other components within normal limits  COMPREHENSIVE METABOLIC PANEL  - Abnormal; Notable for the following:    Calcium 8.2 (*)    Albumin 2.9 (*)    Alkaline Phosphatase 211 (*)    Total Bilirubin 0.1 (*)    All other components within normal limits  URINALYSIS, ROUTINE W REFLEX MICROSCOPIC - Abnormal; Notable for the following:    Leukocytes, UA SMALL (*)    Squamous Epithelial / LPF 0-5 (*)    All other components within normal limits  LIPASE, BLOOD    EKG  EKG Interpretation None       Radiology No results found.  Procedures Procedures (including critical care time)  Medications Ordered in ED Medications  fentaNYL (SUBLIMAZE) 100 MCG/2ML injection (not administered)  fentaNYL (SUBLIMAZE) injection 50 mcg (50 mcg Intravenous Given 09/04/16 1530)  ondansetron (ZOFRAN) injection 4 mg (4 mg Intravenous Given 09/04/16 1529)  sodium chloride 0.9 % bolus 1,000 mL (0 mLs Intravenous Stopped 09/04/16 1640)  ondansetron (ZOFRAN) injection 4 mg (4 mg Intravenous Given 09/04/16 1907)     Initial Impression / Assessment and Plan / ED Course  I have reviewed the triage vital signs and the nursing notes.  Pertinent labs & imaging results that were available during my care of the patient were reviewed by me and considered in my medical decision making (see chart for details).    Likely gastroenteritis, however has bad abdominal pain, so will treat that as well. toco/FHR without abnormalities.  Will d/w fergusion if symptoms don't resolve.   Suspect viral versus food borne causes. Patient's symptoms improved here with Zofran, fluids and a dose of pain medicine. We'll DC on Zofran and Tylenol. Discussed with Dr. Emelda Fear he will see patient on Monday if she calls the office. Patient knows to come back here if any worsening symptoms. Toco and fetal heart rate reviewed by myself and no evidence of contractions or Decels and patient does not feel her pain was c/w contractions.   Final Clinical Impressions(s) / ED Diagnoses   Final diagnoses:  Nausea and  vomiting, intractability of vomiting not specified, unspecified vomiting type    New Prescriptions New Prescriptions   ONDANSETRON (ZOFRAN) 4 MG TABS TABLET    Take 4 tablets by mouth every 8 (eight) hours as needed.     Marily Memos, MD 09/04/16 [phone]

## 2016-09-04 NOTE — ED Notes (Signed)
Patient now complaining of lower back pain.

## 2016-09-04 NOTE — ED Notes (Signed)
Patient is sleeping husband notified when possible a urine sample is needed.

## 2016-09-04 NOTE — ED Notes (Signed)
Patient given dispensed pack of Zofran with verbal instructions given. Patient has verbally understanding.

## 2016-09-04 NOTE — ED Triage Notes (Signed)
Reports of vomiting that started this morning approx. 9 am. States she is having abd pain although denies leaking fluid or back pain. Per patient her son was sick vomiting last night.

## 2016-09-04 NOTE — Progress Notes (Signed)
Received call from from [facility] ED for a G2P1 39.2 weeks complaining of vomiting, flu like symptoms. Pt gets care at [facility]. Placed on fetal monitor. FHR tracing reviewed by Dr Jerrol Banana, informed of pt status, pt may come off monitor. ED RN informed.

## 2016-09-09 ENCOUNTER — Ambulatory Visit (INDEPENDENT_AMBULATORY_CARE_PROVIDER_SITE_OTHER): Payer: Medicaid Other | Admitting: Obstetrics and Gynecology

## 2016-09-09 ENCOUNTER — Encounter: Payer: Self-pay | Admitting: Obstetrics and Gynecology

## 2016-09-09 VITALS — BP 122/84 | HR 70 | Wt 142.8 lb

## 2016-09-09 DIAGNOSIS — R825 Elevated urine levels of drugs, medicaments and biological substances: Secondary | ICD-10-CM | POA: Diagnosis not present

## 2016-09-09 DIAGNOSIS — Z331 Pregnant state, incidental: Secondary | ICD-10-CM

## 2016-09-09 DIAGNOSIS — Z1389 Encounter for screening for other disorder: Secondary | ICD-10-CM

## 2016-09-09 DIAGNOSIS — Z3483 Encounter for supervision of other normal pregnancy, third trimester: Secondary | ICD-10-CM

## 2016-09-09 DIAGNOSIS — Z3A40 40 weeks gestation of pregnancy: Secondary | ICD-10-CM

## 2016-09-09 LAB — POCT URINALYSIS DIPSTICK
Glucose, UA: NEGATIVE
Ketones, UA: NEGATIVE
Leukocytes, UA: NEGATIVE — AB
Nitrite, UA: NEGATIVE
PROTEIN UA: NEGATIVE
RBC UA: NEGATIVE

## 2016-09-09 MED FILL — Ondansetron HCl Tab 4 MG: ORAL | Qty: 4 | Status: AC

## 2016-09-09 NOTE — Progress Notes (Signed)
[id number]  Estimated Date of Delivery: 09/09/16 LROB [redacted]w[redacted]d  Chief Complaint  Patient presents with  . Routine Prenatal Visit    GBS, GC/CHL  ____  Patient complaints: no major complaints at this time. Reports recent nausea and vomiting for which she was seen at [facility]. Patient reports good fetal movement,                          denies any bleeding, rupture of membranes,or regular contractions.  Blood pressure 122/84, pulse 70, weight 142 lb 12.8 oz (64.8 kg).   Urine results:negative refer to the ob flow sheet for FH and FHR, ,                          Physical Examination: General appearance - alert, well appearing, and in no distress                                      Abdomen - FH 36 ,                                                         -FHR 138                                                         soft, nontender, nondistended, no masses or organomegaly                                      Pelvic - cervix is 1 cm, 50%, head is low                                            Questions were answered. Assessment: LROB G2P1001 @ [redacted]w[redacted]d   Plan:  Continued routine obstetrical care,   F/u in 5 days for NST  By signing my name below, I, Sonum Patel, attest that this documentation has been prepared under the direction and in the presence of Christin Bach, MD. Electronically Signed: Sonum Patel, Neurosurgeon. 09/09/16. 9:46 AM.I personally performed the services described in this documentation, which was SCRIBED in my presence. The recorded information has been reviewed and considered accurate. It has been edited as necessary during review. Tilda Burrow, MD

## 2016-09-10 ENCOUNTER — Encounter (HOSPITAL_COMMUNITY): Payer: Self-pay

## 2016-09-10 ENCOUNTER — Inpatient Hospital Stay (HOSPITAL_COMMUNITY)
Admission: AD | Admit: 2016-09-10 | Discharge: 2016-09-12 | DRG: 775 | Disposition: A | Discharge status: Home / Self Care | Payer: Medicaid Other | Source: Ambulatory Visit | Attending: Obstetrics and Gynecology | Admitting: Obstetrics and Gynecology

## 2016-09-10 DIAGNOSIS — O99344 Other mental disorders complicating childbirth: Secondary | ICD-10-CM | POA: Diagnosis present

## 2016-09-10 DIAGNOSIS — O99324 Drug use complicating childbirth: Secondary | ICD-10-CM | POA: Diagnosis present

## 2016-09-10 DIAGNOSIS — O99334 Smoking (tobacco) complicating childbirth: Secondary | ICD-10-CM | POA: Diagnosis present

## 2016-09-10 DIAGNOSIS — Z6791 Unspecified blood type, Rh negative: Secondary | ICD-10-CM | POA: Diagnosis not present

## 2016-09-10 DIAGNOSIS — Z3483 Encounter for supervision of other normal pregnancy, third trimester: Secondary | ICD-10-CM

## 2016-09-10 DIAGNOSIS — F1910 Other psychoactive substance abuse, uncomplicated: Secondary | ICD-10-CM | POA: Diagnosis present

## 2016-09-10 DIAGNOSIS — Z3A40 40 weeks gestation of pregnancy: Secondary | ICD-10-CM

## 2016-09-10 DIAGNOSIS — Z3493 Encounter for supervision of normal pregnancy, unspecified, third trimester: Secondary | ICD-10-CM | POA: Diagnosis present

## 2016-09-10 DIAGNOSIS — O99284 Endocrine, nutritional and metabolic diseases complicating childbirth: Secondary | ICD-10-CM | POA: Diagnosis present

## 2016-09-10 DIAGNOSIS — O26893 Other specified pregnancy related conditions, third trimester: Secondary | ICD-10-CM | POA: Diagnosis present

## 2016-09-10 DIAGNOSIS — Z823 Family history of stroke: Secondary | ICD-10-CM | POA: Diagnosis not present

## 2016-09-10 DIAGNOSIS — F418 Other specified anxiety disorders: Secondary | ICD-10-CM | POA: Diagnosis present

## 2016-09-10 DIAGNOSIS — F17210 Nicotine dependence, cigarettes, uncomplicated: Secondary | ICD-10-CM | POA: Diagnosis present

## 2016-09-10 DIAGNOSIS — F1120 Opioid dependence, uncomplicated: Secondary | ICD-10-CM | POA: Diagnosis present

## 2016-09-10 DIAGNOSIS — E063 Autoimmune thyroiditis: Secondary | ICD-10-CM | POA: Diagnosis present

## 2016-09-10 DIAGNOSIS — O26899 Other specified pregnancy related conditions, unspecified trimester: Secondary | ICD-10-CM

## 2016-09-10 DIAGNOSIS — F11229 Opioid dependence with intoxication, unspecified: Secondary | ICD-10-CM

## 2016-09-10 LAB — CBC
HEMATOCRIT: 37.9 % (ref 36.0–46.0)
HEMOGLOBIN: 13.1 g/dL (ref 12.0–15.0)
MCH: 31 pg (ref 26.0–34.0)
MCHC: 34.6 g/dL (ref 30.0–36.0)
MCV: 89.6 fL (ref 78.0–100.0)
Platelets: 245 10*3/uL (ref 150–400)
RBC: 4.23 MIL/uL (ref 3.87–5.11)
RDW: 13.9 % (ref 11.5–15.5)
WBC: 11.3 10*3/uL — ABNORMAL HIGH (ref 4.0–10.5)

## 2016-09-10 LAB — RAPID URINE DRUG SCREEN, HOSP PERFORMED
AMPHETAMINES: POSITIVE — AB
BARBITURATES: NOT DETECTED
Benzodiazepines: NOT DETECTED
COCAINE: NOT DETECTED
Opiates: POSITIVE — AB
TETRAHYDROCANNABINOL: NOT DETECTED

## 2016-09-10 LAB — PMP SCREEN PROFILE (10S), URINE
AMPHETAMINE SCRN UR: NEGATIVE ng/mL
BARBITURATE SCRN UR: NEGATIVE ng/mL
BENZODIAZEPINE SCREEN, URINE: NEGATIVE ng/mL
CANNABINOIDS UR QL SCN: NEGATIVE ng/mL
COCAINE(METAB.) SCREEN, URINE: NEGATIVE ng/mL
Creatinine(Crt), U: 95.2 mg/dL (ref 20.0–300.0)
Methadone Scn, Ur: POSITIVE ng/mL
Opiate Scrn, Ur: POSITIVE ng/mL
Oxycodone+Oxymorphone Ur Ql Scn: POSITIVE ng/mL
PCP Scrn, Ur: NEGATIVE ng/mL
Ph of Urine: 6.8 (ref 4.5–8.9)
Propoxyphene, Screen: NEGATIVE ng/mL

## 2016-09-10 LAB — GROUP B STREP BY PCR: GROUP B STREP BY PCR: NEGATIVE

## 2016-09-10 MED ORDER — ONDANSETRON HCL 4 MG PO TABS: 4.0000 mg | ORAL_TABLET | ORAL | Status: DC | PRN

## 2016-09-10 MED ORDER — OXYCODONE-ACETAMINOPHEN 5-325 MG PO TABS: 1.0000 | ORAL_TABLET | ORAL | Status: DC | PRN

## 2016-09-10 MED ORDER — ONDANSETRON HCL 4 MG/2ML IJ SOLN: 4.0000 mg | Freq: Four times a day (QID) | INTRAMUSCULAR | Status: DC | PRN

## 2016-09-10 MED ORDER — SIMETHICONE 80 MG PO CHEW: 80.0000 mg | CHEWABLE_TABLET | ORAL | Status: DC | PRN

## 2016-09-10 MED ORDER — PRENATAL MULTIVITAMIN CH
1.0000 | ORAL_TABLET | Freq: Every day | ORAL | Status: DC
  Administered 2016-09-11 – 2016-09-12 (×2): 1 via ORAL
  Filled 2016-09-10 (×2): qty 1

## 2016-09-10 MED ORDER — LACTATED RINGERS IV SOLN: 500.0000 mL | Freq: Once | INTRAVENOUS | Status: DC

## 2016-09-10 MED ORDER — PHENYLEPHRINE 40 MCG/ML (10ML) SYRINGE FOR IV PUSH (FOR BLOOD PRESSURE SUPPORT)
80.0000 ug | PREFILLED_SYRINGE | INTRAVENOUS | Status: DC | PRN
  Filled 2016-09-10: qty 5

## 2016-09-10 MED ORDER — SENNOSIDES-DOCUSATE SODIUM 8.6-50 MG PO TABS
2.0000 | ORAL_TABLET | ORAL | Status: DC
  Administered 2016-09-11: 2 via ORAL
  Filled 2016-09-10: qty 2

## 2016-09-10 MED ORDER — BENZOCAINE-MENTHOL 20-0.5 % EX AERO
1.0000 | INHALATION_SPRAY | CUTANEOUS | Status: DC | PRN
Start: 2016-09-10 — End: 2016-09-12
  Administered 2016-09-12: 1 via TOPICAL
  Filled 2016-09-10: qty 56

## 2016-09-10 MED ORDER — LACTATED RINGERS IV SOLN: INTRAVENOUS | Status: DC

## 2016-09-10 MED ORDER — DIPHENHYDRAMINE HCL 25 MG PO CAPS: 25.0000 mg | ORAL_CAPSULE | Freq: Four times a day (QID) | ORAL | Status: DC | PRN

## 2016-09-10 MED ORDER — FENTANYL 2.5 MCG/ML BUPIVACAINE 1/10 % EPIDURAL INFUSION (WH - ANES)
14.0000 mL/h | INTRAMUSCULAR | Status: DC | PRN
  Filled 2016-09-10: qty 100

## 2016-09-10 MED ORDER — WITCH HAZEL-GLYCERIN EX PADS: 1.0000 "application " | MEDICATED_PAD | CUTANEOUS | Status: DC | PRN

## 2016-09-10 MED ORDER — OXYCODONE-ACETAMINOPHEN 5-325 MG PO TABS: 2.0000 | ORAL_TABLET | ORAL | Status: DC | PRN

## 2016-09-10 MED ORDER — PHENYLEPHRINE 40 MCG/ML (10ML) SYRINGE FOR IV PUSH (FOR BLOOD PRESSURE SUPPORT)
80.0000 ug | PREFILLED_SYRINGE | INTRAVENOUS | Status: DC | PRN
  Filled 2016-09-10: qty 10
  Filled 2016-09-10: qty 5

## 2016-09-10 MED ORDER — ONDANSETRON HCL 4 MG/2ML IJ SOLN: 4.0000 mg | INTRAMUSCULAR | Status: DC | PRN

## 2016-09-10 MED ORDER — TETANUS-DIPHTH-ACELL PERTUSSIS 5-2.5-18.5 LF-MCG/0.5 IM SUSP
0.5000 mL | Freq: Once | INTRAMUSCULAR | Status: DC
  Filled 2016-09-10: qty 0.5

## 2016-09-10 MED ORDER — EPHEDRINE 5 MG/ML INJ
10.0000 mg | INTRAVENOUS | Status: DC | PRN
  Filled 2016-09-10: qty 2

## 2016-09-10 MED ORDER — ACETAMINOPHEN 325 MG PO TABS: 650.0000 mg | ORAL_TABLET | ORAL | Status: DC | PRN

## 2016-09-10 MED ORDER — ZOLPIDEM TARTRATE 5 MG PO TABS: 5.0000 mg | ORAL_TABLET | Freq: Every evening | ORAL | Status: DC | PRN

## 2016-09-10 MED ORDER — OXYTOCIN BOLUS FROM INFUSION
500.0000 mL | Freq: Once | INTRAVENOUS | Status: AC
  Administered 2016-09-10: 500 mL via INTRAVENOUS

## 2016-09-10 MED ORDER — LIDOCAINE HCL (PF) 1 % IJ SOLN
30.0000 mL | INTRAMUSCULAR | Status: DC | PRN
  Filled 2016-09-10: qty 30

## 2016-09-10 MED ORDER — LACTATED RINGERS IV SOLN
500.0000 mL | INTRAVENOUS | Status: DC | PRN
  Administered 2016-09-10: 500 mL via INTRAVENOUS

## 2016-09-10 MED ORDER — COCONUT OIL OIL: 1.0000 "application " | TOPICAL_OIL | Status: DC | PRN

## 2016-09-10 MED ORDER — METHADONE HCL 10 MG PO TABS
75.0000 mg | ORAL_TABLET | Freq: Every day | ORAL | Status: DC
  Administered 2016-09-11 – 2016-09-12 (×2): 75 mg via ORAL
  Filled 2016-09-10 (×3): qty 8

## 2016-09-10 MED ORDER — DIPHENHYDRAMINE HCL 50 MG/ML IJ SOLN: 12.5000 mg | INTRAMUSCULAR | Status: DC | PRN

## 2016-09-10 MED ORDER — FENTANYL CITRATE (PF) 100 MCG/2ML IJ SOLN
100.0000 ug | Freq: Once | INTRAMUSCULAR | Status: AC
  Administered 2016-09-10: 100 ug via INTRAVENOUS
  Filled 2016-09-10: qty 2

## 2016-09-10 MED ORDER — OXYTOCIN 40 UNITS IN LACTATED RINGERS INFUSION - SIMPLE MED
2.5000 [IU]/h | INTRAVENOUS | Status: DC
  Filled 2016-09-10: qty 1000

## 2016-09-10 MED ORDER — SOD CITRATE-CITRIC ACID 500-334 MG/5ML PO SOLN: 30.0000 mL | ORAL | Status: DC | PRN

## 2016-09-10 MED ORDER — IBUPROFEN 600 MG PO TABS
600.0000 mg | ORAL_TABLET | Freq: Four times a day (QID) | ORAL | Status: DC
  Administered 2016-09-10 – 2016-09-12 (×7): 600 mg via ORAL
  Filled 2016-09-10 (×7): qty 1

## 2016-09-10 MED ORDER — DIBUCAINE 1 % RE OINT: 1.0000 "application " | TOPICAL_OINTMENT | RECTAL | Status: DC | PRN

## 2016-09-10 NOTE — MAU Note (Signed)
Pt to MAU with c/o contractions since 1300 this afternoon, denies LOF or vaginal bleeding. + FM

## 2016-09-10 NOTE — H&P (Signed)
OBSTETRIC ADMISSION HISTORY AND PHYSICAL  Nina Herrera is a 31 y.o. female G2P1001 with IUP at [redacted]w[redacted]d by 7 week [place] presenting for SOL. She reports +FMs, No LOF, no VB, no blurry vision, headaches or peripheral edema, and RUQ pain.  She plans on breast feeding. She request depo for birth control.  Dating: By 7 week [place] --->  Estimated Date of Delivery: 09/09/16  Prenatal History/Complications:  Past Medical History: Past Medical History:  Diagnosis Date  . Hypothyroidism    age 29,     Past Surgical History: Past Surgical History:  Procedure Laterality Date  . NO PAST SURGERIES      Obstetrical History: OB History    Gravida Para Term Preterm AB Living   SAB TAB Ectopic Multiple Live Births           1      Social History: Social History   Social History  . Marital status: Single    Spouse name: N/A  . Number of children: N/A  . Years of education: N/A   Social History Main Topics  . Smoking status: Current Every Day Smoker    Packs/day: 0.25    Years: 13.00    Types: Cigarettes  . Smokeless tobacco: Never Used  . Alcohol use No  . Drug use: No  . Sexual activity: Yes    Birth control/ protection: None   Other Topics Concern  . None   Social History Narrative  . None    Family History: Family History  Problem Relation Age of Onset  . Stroke Mother     Allergies: No Known Allergies  Prescriptions Prior to Admission  Medication Sig Dispense Refill Last Dose  . ferrous sulfate 325 (65 FE) MG tablet Take 1 tablet (325 mg total) by mouth 2 (two) times daily with a meal. 60 tablet 3 Taking  . methadone (DOLOPHINE) 10 MG tablet Take 75 mg by mouth daily.    Taking  . Prenatal Multivit-Min-Fe-FA (PRENATAL VITAMINS PO) Take 1 tablet by mouth daily.   Taking     Review of Systems   All systems reviewed and negative except as stated in HPI  Blood pressure 119/74, pulse 83, temperature 98.2 F (36.8 C), temperature source Oral, resp. rate  18, height  (1.676 m), weight 64.4 kg (142 lb). General appearance: alert, cooperative, appears older than stated age and moderate distress Lungs: clear to auscultation bilaterally Heart: regular rate and rhythm Abdomen: soft, non-tender; bowel sounds normal Pelvic: 6/90/-2 Extremities: Homans sign is negative, no sign of DVT Presentation: cephalic Fetal monitoring: 135 Uterine activityFrequency: Every 2 minutes Dilation: 6 Effacement (%): 90 Station: -1 Exam by:: r Garwood Wentzell md   Prenatal labs: ABO, Rh: O/Negative/-- (09/13 1624) Antibody: Negative (01/24 0928) Rubella: immune RPR: Non Reactive (01/24 0928)  HBsAg: Negative (09/13 1624)  HIV: Non Reactive (01/24 0928)  GBS:   unknown 1 hr Glucola 83/82/87 Genetic screening  no Anatomy US wnl  Prenatal Transfer Tool  Maternal Diabetes: No Genetic Screening: too late to care Maternal Ultrasounds/Referrals: Normal Fetal Ultrasounds or other Referrals:  None Maternal Substance Abuse:  Yes:  Type: Cocaine, Prescription drugs, Methadone Significant Maternal Medications:  Meds include: Other: methadone Significant Maternal Lab Results: Lab values include: Other: GBS unknown  No results found for this or any previous visit (from the past 24 hour(s)).  Patient Active Problem List   Diagnosis Date Noted  . Normal labor 09/10/2016  .  Polysubstance abuse 02/18/2016  . Rh negative state in antepartum period 02/18/2016  . Supervision of normal pregnancy 02/12/2016  . 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 Opioid dependence (HCC) 02/12/2016  . Hypothyroidism   . GAD (generalized anxiety disorder) 09/02/2015  . Irregular menses 06/30/2011  . Anxiety 06/30/2011  . Hair loss 06/30/2011  . Brittle nails 06/30/2011  . Autoimmune thyroiditis 06/30/2011  . Depression 06/30/2011    Assessment: Nina Herrera is a 31 y.o. G2P1001 at [redacted]w[redacted]d here for SOL in active labor  #Labor:expectant management #Pain: Fentanyl  prn #FWB: Category 1 tracing #ID:  GBS unknown, rapid sent and pending, not treated #MOF: breast #MOC:depo #Circ:  undecided  Durenda Hurt, MD 09/10/2016, 6:26 PM

## 2016-09-11 LAB — RPR: RPR: NONREACTIVE

## 2016-09-11 LAB — GC/CHLAMYDIA PROBE AMP
CHLAMYDIA, DNA PROBE: NEGATIVE
NEISSERIA GONORRHOEAE BY PCR: NEGATIVE

## 2016-09-11 LAB — STREP GP B NAA: STREP GROUP B AG: NEGATIVE

## 2016-09-11 MED ORDER — RHO D IMMUNE GLOBULIN 1500 UNIT/2ML IJ SOSY
300.0000 ug | PREFILLED_SYRINGE | Freq: Once | INTRAMUSCULAR | Status: AC
  Administered 2016-09-11: 300 ug via INTRAMUSCULAR
  Filled 2016-09-11: qty 2

## 2016-09-11 NOTE — Lactation Note (Signed)
This note was copied from a baby's chart. Lactation Consultation Note Mom 1st child BF for 6 months. Mom stated had milk stored up that she gave BM longer. Mom is breast/formula/bottle.  RN set up DEBP, mom pumped Rt. Breast w/81ml collected and gave to baby.  When LC entered rm. Mom was walking in rm holding baby feeding formula in bottle.  Encouraged to BR or offer BM before giving formula important to give her BM to the baby. (significant other in rm sleeping on couch, didn;t discuss Methadone d/t privacy.) Mom encouraged to feed baby 8-12 times/24 hours and with feeding cues. Mom encouraged to waken baby for feeds.  Educated about newborn behavior,  STS, I&O, cluster feeding, supply and demand. WH/LC brochure given w/resources, support groups and LC services. Patient Name: Nina Herrera [id number] Date: 09/11/2016 Reason for consult: Initial assessment   Maternal Data Has patient been taught Hand Expression?: Yes Does the patient have breastfeeding experience prior to this delivery?: Yes  Feeding Feeding Type: Bottle Fed - Formula Nipple Type: Slow - flow  LATCH Score/Interventions                      Lactation Tools Discussed/Used Tools: Pump Breast pump type: Double-Electric Breast Pump Pump Review: Setup, frequency, and cleaning;Milk Storage Initiated by:: RN Date initiated:: 09/10/16   Consult Status Consult Status: Follow-up Date: 09/12/16 Follow-up type: In-patient    [name], Diamond Nickel 09/11/2016, 3:04 AM

## 2016-09-11 NOTE — Progress Notes (Signed)
Post Partum Day #1 Subjective: no complaints, up ad lib, voiding, tolerating PO and reports normal lochia  Objective: Blood pressure 94/82, pulse 69, temperature 98 F (36.7 C), resp. rate 18, height  (1.676 m), weight 64.4 kg (142 lb), SpO2 98 %, unknown if currently breastfeeding.  Physical Exam:  General: alert Lochia: appropriate Uterine Fundus: firm and NT at U-4 DVT Evaluation: No evidence of DVT seen on physical exam.   Recent Labs  09/10/16 1800  HGB 13.1  HCT 37.9    Assessment/Plan: Plan for discharge tomorrow  Depo provera for contraception   LOS: 1 day   Dezmon Conover C Sarajean Dessert 09/11/2016, 7:30 AM

## 2016-09-11 NOTE — Discharge Instructions (Signed)

## 2016-09-11 NOTE — Discharge Summary (Signed)
OB Discharge Summary     Patient Name: Nina Herrera DOB: [birth year] MRN: [id number]  Date of admission: 09/10/2016 Delivering MD: Maryjo Rochester L   Date of discharge: 09/11/2016  Admitting diagnosis: 40WKS LABOR Intrauterine pregnancy: [redacted]w[redacted]d     Secondary diagnosis:  Active Problems:   Opioid dependence (HCC)   Polysubstance abuse   Rh negative state in antepartum period   Normal labor   NSVD (normal spontaneous vaginal delivery)  Additional problems: hypothyroidism, Depression and GAD     Discharge diagnosis: Term Pregnancy Delivered                                                                                                Post partum procedures:rhogam  Augmentation: AROM  Complications: None  Hospital course:  Onset of Labor With Vaginal Delivery     31 y.o. yo [id number] at [redacted]w[redacted]d was admitted in Active Labor on 09/10/2016. Patient had an uncomplicated labor course as follows:  Membrane Rupture Time/Date: 6:52 PM ,09/10/2016   Intrapartum Procedures: Episiotomy: None [1]                                         Lacerations:  None [1]  Patient had a delivery of a Viable infant. 09/10/2016  Information for the patient's newborn:  Laquashia, Mergenthaler [[id number]]  Delivery Method: Vag-Spont    Pateint had an uncomplicated postpartum course.  She is ambulating, tolerating a regular diet, passing flatus, and urinating well. Patient is discharged home in stable condition on 09/11/16. SW consult pending- unsure of infant dispo status.   Physical exam  Vitals:   09/10/16 2159 09/11/16 0142 09/11/16 0543 09/11/16 1824  BP: 114/76 121/75 94/82 111/74  Pulse: 83 63 69 75  Resp: Temp: 97.8 F (36.6 C) 98.5 F (36.9 C) 98 F (36.7 C) 98 F (36.7 C)  TempSrc: Oral Oral  Oral  SpO2: 98% 98%    Weight:      Height:       General: alert, cooperative and no distress Lochia: appropriate Uterine Fundus: firm Incision: N/A DVT Evaluation: No evidence of DVT seen on  physical exam. Labs: Lab Results  Component Value Date   WBC 11.3 (H) 09/10/2016   HGB 13.1 09/10/2016   HCT 37.9 09/10/2016   MCV 89.6 09/10/2016   PLT 245 09/10/2016   CMP Latest Ref Rng & Units 09/04/2016  Glucose 65 - 99 mg/dL 93  BUN 6 - 20 mg/dL 13  Creatinine 1.30 - 8.65 mg/dL 7.84  Sodium 696 - 295 mmol/L 136  Potassium 3.5 - 5.1 mmol/L 4.3  Chloride 101 - 111 mmol/L 102  CO2 22 - 32 mmol/L 27  Calcium 8.9 - 10.3 mg/dL 8.2(L)  Total Protein 6.5 - 8.1 g/dL 6.7  Total Bilirubin 0.3 - 1.2 mg/dL 2.8(U)  Alkaline Phos 38 - 126 U/L 211(H)  AST 15 - 41 U/L 21  ALT 14 - 54 U/L 14    Discharge instruction: per  After Visit Summary and "Baby and Me Booklet".  After visit meds: Allergies as of 09/12/2016   No Known Allergies     Medication List    STOP taking these medications   acetaminophen 500 MG tablet Commonly known as:  TYLENOL   calcium carbonate 500 MG chewable tablet Commonly known as:  TUMS - dosed in mg elemental calcium     TAKE these medications   ferrous sulfate 325 (65 FE) MG tablet Take 1 tablet (325 mg total) by mouth 2 (two) times daily with a meal.   ibuprofen 600 MG tablet Commonly known as:  ADVIL,MOTRIN Take 1 tablet (600 mg total) by mouth every 6 (six) hours as needed.   methadone 10 MG tablet Commonly known as:  DOLOPHINE Take 75 mg by mouth daily.   PRENATAL VITAMINS PO Take 1 tablet by mouth daily.        Diet: routine diet  Activity: Advance as tolerated. Pelvic rest for 6 weeks.   Outpatient follow up:2 weeks given history of depression, GAD and hypothyroidism Follow up Appt: Future Appointments Date Time Provider Department Center  10/23/2016 11:00 AM Cheral Marker, CNM [facility]   Follow up Visit:No Follow-up on file.  Postpartum contraception: Depo Provera  Newborn Data: Live born female  Birth Weight: 6 lb 14.6 oz (3135 g) APGAR: 9, 9  Baby Feeding: Breast Disposition:rooming in, baby staying til  Monday for NAS   Cam Hai CNM 09/12/2016 9:13 AM   12:06 PM - Reviewed SW note, baby to stay til Monday for NAS, CPS has been notified of positive drug screens and methadone use. OK for discharge, may room in with baby if available. Cleda Clarks, DO  OB Fellow

## 2016-09-12 LAB — RH IG WORKUP (INCLUDES ABO/RH)
ABO/RH(D): O NEG
Fetal Screen: NEGATIVE
UNIT DIVISION: 0

## 2016-09-12 MED ORDER — IBUPROFEN 600 MG PO TABS: 600.0000 mg | ORAL_TABLET | Freq: Four times a day (QID) | ORAL | 0 refills | Status: DC | PRN

## 2016-09-12 NOTE — Progress Notes (Signed)
The following note was copied from babys chart to MOB's chart. Assessment was completed by Terri Piedra, MSW, LCSW on 09/11/2016.  CLINICAL SOCIAL WORK MATERNAL/CHILD NOTE  Patient Details  Name: Nina Herrera MRN: [id number] Date of Birth: 09/10/2014  Date:  09/11/2016  Clinical Social Worker Initiating Note:  Terri Piedra, [place]      Date/ Time Initiated:  09/11/16/1200              Child's Name:  Nina Herrera   Legal Guardian:  Other (Comment) (Parents: Nina Herrera and Nina Herrera)   Need for Interpreter:  None   Date of Referral:  09/11/16     Reason for Referral:  Current Substance Use/Substance Use During Pregnancy , Other (Comment) (Hx of Anx/Dep)   Referral Source:  [facility]   Address:  [address], [place], [place] [address]  Phone number:  [phone]   Household Members: Minor Children, Significant Other (MOB and FOB live together in a home that FOB owns.  MOB's 60 year old child/Nina Herrera also lives in the home.  FOB has a 74 year old son named Nina Herrera who lives in [place].)   Natural Supports (not living in the home): Friends, Immediate Family (Parents were vague about supports and state they have "friends and som family.")   Professional Supports:Other (Comment) (Both parents receive substance abuse treatment from [facility])   Employment:    Type of Work: FOB makes [employer].  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   Education:      Financial Resources:Medicaid   Other Resources:     Cultural/Religious Considerations Which May Impact Care:None stated.  Strengths: Ability to meet basic needs , Pediatrician chosen , Home prepared for child , Compliance with medical plan , Understanding of illness (MOB states pediatric follow up will be at [facility].)   Risk Factors/Current Problems: Substance Use    Cognitive State: Able to Concentrate , Alert ,  Linear Thinking , Goal Oriented    Mood/Affect: Calm , Comfortable , Interested    CSW Assessment:CSW met with parents in MOB's first floor room/134 to offer support and complete assessment due to polysubstance use and hx of Anx/Dep.  Parents were welcoming and friendly.  MOB appropriately attended to baby's needs while CSW was present and observed her holding baby, consoling baby, and feeding baby a bottle.   Parents report that they are excited about baby and have everything they need for him at home.  They are aware of SIDS precautions as reviewed by CSW.  Parents state that they have known each other for a long time and dated about 10 years ago.  They got back together about a year ago.   MOB denies symptoms of Anx/Dep at this time.  She reports hx of Xanax use (with prescription), but states she did not take Xanax during pregnancy and feels better now that she is not taking it.  She denies any need for mental health treatment and discussed little about her mental health hx.  CSW provided education regarding signs and symptoms of PMADs and asked MOB to talk with her doctor if she has concerns at any time.  MOB agreed.  CSW informed her of support groups held at [facility] hospital and provided her with a new mom checklist as a way to self-evaluate.  MOB stated appreciation. CSW inquired about substance use hx and Methadone treatment.  MOB reports that her substance use started with Lortab  use and that she wishes she had never gotten on Methadone.  She reports Methadone treatment for the past 4 years.  CSW informed her of drug screens positive for cocaine, opiates, benzodiazepines and amphetamines during pregnancy.  MOB denies cocaine use after finding out she was pregnant.  She was positive for cocaine on 02/12/16.  MOB reports that the only benzodiazepine was Xanax, for which she had a prescription.  She was positive for benzodiazepines on 02/12/16.  She was positive for opiates on 02/12/16, 05/15/16,  06/24/16, 07/08/16, and 09/10/16.  MOB states she is positive for opiates because of oxycodone use.  She admits that she does not have a prescriptions for oxycodone.  Heroin use is documented in MOB's medical record, however, MOB denies every using heroin.  MOB was positive for Amphetamines on 05/15/16, 06/24/16, 07/08/16 and 09/10/16.  She states she was taking Adderall without a prescription.  FOB reports that he too is in the Methadone treatment program at [facility] and denies all substance use other than hx of "pain pills" after a shoulder injury.  He states he has been on Methadone for 4 months.  Both parents state that they feel they are receiving the appropriate level of care to address their substance use concerns.  MOB denies need for inpatient rehabilitation.  CSW informed parents of hospital drug screen policy and mandated reporting to Child Protective Services.  Parents stated understanding.  Baby's first void was not collected or sent for testing.  MOB reports there are cotton balls in his diaper at this time.  Since CDS results will take a few days to return and MOB has polysubstance use in pregnancy, with a positive screen on admission, CSW made report to Child Protective Services in [place].  CSW will follow up on Monday given baby will still be in the hospital for NAS monitoring.    CSW Plan/Description: Child Copy Report , Information/Referral to [employer] , Patient/Family Education     Terri Piedra [place], [place] 09/11/2016, 1:47 PM

## 2016-09-13 LAB — BPAM RBC
BLOOD PRODUCT EXPIRATION DATE: 201804282359
BLOOD PRODUCT EXPIRATION DATE: 201805072359
UNIT TYPE AND RH: 9500
UNIT TYPE AND RH: 9500

## 2016-09-13 LAB — TYPE AND SCREEN
ABO/RH(D): O NEG
ANTIBODY SCREEN: POSITIVE
DAT, IgG: NEGATIVE
UNIT DIVISION: 0
Unit division: 0

## 2016-09-14 ENCOUNTER — Other Ambulatory Visit: Payer: Medicaid Other | Admitting: Obstetrics & Gynecology

## 2016-09-14 ENCOUNTER — Ambulatory Visit: Payer: Self-pay

## 2016-09-14 ENCOUNTER — Other Ambulatory Visit: Payer: Medicaid Other | Admitting: Obstetrics and Gynecology

## 2016-09-14 NOTE — Lactation Note (Addendum)
This note was copied from a baby's chart. Lactation Consultation Note: Mother pumping and bottle feeding infant . Mother reports that infant is taking 45 ml of ebm with each feeding. Advised mother to continue to pump every 2-3 hours. Mother inquired about pump rental . Mother is not active with WIC. Advised mother to phone [facility] and schedule an appt. Discussed possible WIC Loaner pump. Mother was given a harmony hand pump with instructions to use every 2-3 hours for 15 mins on each breast. Mother to page for assistance as needed.   Patient Name: Nina Herrera [id number] Date: 09/14/2016 Reason for consult: Follow-up assessment   Maternal Data    Feeding Feeding Type: Bottle Fed - Formula Nipple Type: Slow - flow  LATCH Score/Interventions                      Lactation Tools Discussed/Used     Consult Status Consult Status: Follow-up Date: 09/14/16 Follow-up type: In-patient    Stevan Born [facility] 09/14/2016, 3:03 PM

## 2016-09-17 ENCOUNTER — Ambulatory Visit: Payer: Self-pay

## 2016-09-17 NOTE — Lactation Note (Signed)
This note was copied from a baby's chart. Lactation Consultation Note  Patient Name: Nina Herrera [id number] Date: 09/17/2016 Reason for consult: Follow-up assessment;NICU baby   Follow up with mom of 7 day old infant at infant's bedside in NICU. MOm reports pumping is going well. She declined need for assistance. She reports she plans to mainly pump and bottle feed infant as she did nwith her first child. Enc mom to call for assistance as needed.    Maternal Data Formula Feeding for Exclusion: No  Feeding Feeding Type: Bottle Fed - Breast Milk Nipple Type: Slow - flow Length of feed: 30 min  LATCH Score/Interventions                      Lactation Tools Discussed/Used     Consult Status Consult Status: PRN Follow-up type: Call as needed    Ed Blalock 09/17/2016, 2:03 PM

## 2016-10-23 ENCOUNTER — Ambulatory Visit: Payer: Medicaid Other | Admitting: Women's Health

## 2016-11-03 ENCOUNTER — Ambulatory Visit: Payer: Medicaid Other | Admitting: Advanced Practice Midwife

## 2016-11-03 ENCOUNTER — Encounter: Payer: Self-pay | Admitting: *Deleted

## 2016-11-19 ENCOUNTER — Encounter: Payer: Self-pay | Admitting: Advanced Practice Midwife

## 2016-11-19 ENCOUNTER — Telehealth: Payer: Self-pay | Admitting: Advanced Practice Midwife

## 2016-11-19 ENCOUNTER — Ambulatory Visit (INDEPENDENT_AMBULATORY_CARE_PROVIDER_SITE_OTHER): Payer: Medicaid Other | Admitting: Advanced Practice Midwife

## 2016-11-19 DIAGNOSIS — N898 Other specified noninflammatory disorders of vagina: Secondary | ICD-10-CM | POA: Diagnosis not present

## 2016-11-19 MED ORDER — MEDROXYPROGESTERONE ACETATE 150 MG/ML IM SUSP: 150.0000 mg | INTRAMUSCULAR | 3 refills | Status: DC

## 2016-11-19 NOTE — Telephone Encounter (Signed)
Patient called in stating that she came into the office in the am and her medications were called in a pharmacy in [place], pt states that she does not leave in [place] anymore. Please contact pt

## 2016-11-19 NOTE — Telephone Encounter (Signed)
Depo called to [facility] in [place].

## 2016-11-19 NOTE — Progress Notes (Signed)
Nina Herrera is a 31 y.o. who presents for a postpartum visit. She is 10 weeks postpartum following a spontaneous vaginal delivery. I have fully reviewed the prenatal and intrapartum course. The delivery was at 40.1 gestational weeks.  Anesthesia: none. Postpartum course has been uneventful. Baby's course has been uneventful. Stayed in the hospital d/t mom's polysubstance abuse.  Pt is still on methadone, admitted to [facility] in hospital that she has been taking unprescribed Adderal and Oxycodone along with the Methadone.  Baby is feeding by breast. Bleeding: no bleeding and generally is amenorrhic d/t thyoid issues. Bowel function is normal. Bladder function is normal. Patient is sexually active. Contraception method is .condos/vaginal film  Postpartum depression screening: negative. Noticed some DC a few daysa go   Current Outpatient Prescriptions:  .  ferrous sulfate 325 (65 FE) MG tablet, Take 1 tablet (325 mg total) by mouth 2 (two) times daily with a meal. (Patient not taking: Reported on 11/19/2016), Disp: 60 tablet, Rfl: 3 .  ibuprofen (ADVIL,MOTRIN) 600 MG tablet, Take 1 tablet (600 mg total) by mouth every 6 (six) hours as needed. (Patient not taking: Reported on 11/19/2016), Disp: 30 tablet, Rfl: 0 .  medroxyPROGESTERone (DEPO-PROVERA) 150 MG/ML injection, Inject 1 mL (150 mg total) into the muscle every 3 (three) months., Disp: 1 mL, Rfl: 3 .  methadone (DOLOPHINE) 10 MG tablet, Take 75 mg by mouth daily. , Disp: , Rfl:  .  Prenatal Multivit-Min-Fe-FA (PRENATAL VITAMINS PO), Take 1 tablet by mouth daily., Disp: , Rfl:   Review of Systems   Constitutional: Negative for fever and chills Eyes: Negative for visual disturbances Respiratory: Negative for shortness of breath, dyspnea Cardiovascular: Negative for chest pain or palpitations  Gastrointestinal: Negative for vomiting, diarrhea and constipation Genitourinary: Negative for dysuria and urgency  Musculoskeletal: Negative for back pain, joint  pain, myalgias  Neurological: Negative for dizziness and headaches    Objective:     Vitals:   11/19/16 1138  BP: 118/82  Pulse: 90   General:  alert, cooperative and no distress   Breasts:  negative  Lungs: clear to auscultation bilaterally  Heart:  regular rate and rhythm  Abdomen: Soft, nontender   Vulva:  normal  Vagina: normal vagina; dc looks normal . Wet pre + WBC o nly  Cervix:  closed  Corpus: Well involuted     Rectal Exam: no hemorrhoids        Assessment:    normal postpartum exam.  Plan:   1. Contraception: Depo-Provera injections 2. Follow up in:   or as needed.  3>  G/CHL

## 2016-11-21 LAB — GC/CHLAMYDIA PROBE AMP
Chlamydia trachomatis, NAA: NEGATIVE
NEISSERIA GONORRHOEAE BY PCR: NEGATIVE

## 2017-03-15 ENCOUNTER — Encounter: Payer: Self-pay | Admitting: Obstetrics & Gynecology

## 2017-03-15 ENCOUNTER — Ambulatory Visit (INDEPENDENT_AMBULATORY_CARE_PROVIDER_SITE_OTHER): Payer: Self-pay | Admitting: Obstetrics & Gynecology

## 2017-03-15 VITALS — BP 90/70 | HR 81 | Ht 65.0 in | Wt 113.0 lb

## 2017-03-15 DIAGNOSIS — N898 Other specified noninflammatory disorders of vagina: Secondary | ICD-10-CM

## 2017-03-15 DIAGNOSIS — Z3202 Encounter for pregnancy test, result negative: Secondary | ICD-10-CM

## 2017-03-15 DIAGNOSIS — N730 Acute parametritis and pelvic cellulitis: Secondary | ICD-10-CM

## 2017-03-15 DIAGNOSIS — F1110 Opioid abuse, uncomplicated: Secondary | ICD-10-CM

## 2017-03-15 LAB — POCT URINE PREGNANCY: Preg Test, Ur: NEGATIVE

## 2017-03-15 MED ORDER — DOXYCYCLINE HYCLATE 100 MG PO TABS: 100.0000 mg | ORAL_TABLET | Freq: Two times a day (BID) | ORAL | 0 refills | Status: DC

## 2017-03-15 MED ORDER — DESOGESTREL-ETHINYL ESTRADIOL 0.15-30 MG-MCG PO TABS: 1.0000 | ORAL_TABLET | Freq: Every day | ORAL | 11 refills | Status: DC

## 2017-03-15 NOTE — Progress Notes (Signed)
Chief Complaint  Patient presents with  . vaginal irritation    requests pregnancy test     Blood pressure 90/70, pulse 81, height  (1.651 m), weight 113 lb (51.3 kg), last menstrual period 03/08/2017, not currently breastfeeding.  31 y.o. G2P2001 Patient's last menstrual period was 03/08/2017. The current method of family planning is nothing  Outpatient Encounter Prescriptions as of 03/15/2017  Medication Sig  . ibuprofen (ADVIL,MOTRIN) 600 MG tablet Take 1 tablet (600 mg total) by mouth every 6 (six) hours as needed. (Patient taking differently: Take 600 mg by mouth 2 (two) times daily. )  . desogestrel-ethinyl estradiol (APRI,EMOQUETTE,SOLIA) 0.15-30 MG-MCG tablet Take 1 tablet by mouth daily.  Marland Kitchen doxycycline (VIBRA-TABS) 100 MG tablet Take 1 tablet (100 mg total) by mouth 2 (two) times daily.  . [DISCONTINUED] ferrous sulfate 325 (65 FE) MG tablet Take 1 tablet (325 mg total) by mouth 2 (two) times daily with a meal. (Patient not taking: Reported on 11/19/2016)  . [DISCONTINUED] medroxyPROGESTERone (DEPO-PROVERA) 150 MG/ML injection Inject 1 mL (150 mg total) into the muscle every 3 (three) months.  . [DISCONTINUED] methadone (DOLOPHINE) 10 MG tablet Take 75 mg by mouth daily.   . [DISCONTINUED] Prenatal Multivit-Min-Fe-FA (PRENATAL VITAMINS PO) Take 1 tablet by mouth daily.   No facility-administered encounter medications on file as of 03/15/2017.     Subjective Nina Herrera is in complaining of vaginal discomfort over the past few days Had a normal period the other day but has some bleedin gnow No discharge There has been an unpleasant  odor Has had antibiotics for a sinus infection a couple of weeks ago Pain with intercourse x 1, last night after felt irritated Has not used any vaginal medicines Discomfort is mild to moderate No precipitating factors   Objective General WDWN female NAD Vulva:  normal appearing vulva with no masses, tenderness or  lesions Vagina:  normal mucosa, scant blood, no discharge is noted Cervix:  cervical motion tenderness is present no lesions  Uterus:  Normal size small tender to palpation Adnexa: ovaries:firm mass noted on right adnexa both are tender but normal size,     Pertinent ROS No burning with urination, frequency or urgency No nausea, vomiting or diarrhea Nor fever chills or other constitutional symptoms   Labs or studies none    Impression Diagnoses this Encounter::   ICD-10-CM   1. PID (acute pelvic inflammatory disease) N73.0   2. Vaginal discharge N89.8 GC/Chlamydia Probe Amp  3. Pregnancy examination or test, negative result Z32.02 POCT urine pregnancy  4. Narcotic abuse (HCC) F11.10     Established relevant diagnosis(es):   Plan/Recommendations: Meds ordered this encounter  Medications  . doxycycline (VIBRA-TABS) 100 MG tablet    Sig: Take 1 tablet (100 mg total) by mouth 2 (two) times daily.    Dispense:  20 tablet    Refill:  0  . desogestrel-ethinyl estradiol (APRI,EMOQUETTE,SOLIA) 0.15-30 MG-MCG tablet    Sig: Take 1 tablet by mouth daily.    Dispense:  1 Package    Refill:  11    Labs or Scans Ordered: Orders Placed This Encounter  Procedures  . GC/Chlamydia Probe Amp  . POCT urine pregnancy    Management:: I am concerned my have PID, cultures are sent Will treat empirically with doxycycline 100 mg BID, pt understands to take with food Follow up 2 weeks for re examination  Follow up Return in about 2 weeks (around 03/29/2017) for Follow up, with  Dr Despina Hidden.   All questions were answered.  Past Medical History:  Diagnosis Date  . Hypothyroidism    age 23,     Past Surgical History:  Procedure Laterality Date  . NO PAST SURGERIES      OB History    Gravida Para Term Preterm AB Living   SAB TAB Ectopic Multiple Live Births         0 2      No Known Allergies  Social History   Social History  . Marital status: Single     Spouse name: N/A  . Number of children: N/A  . Years of education: N/A   Social History Main Topics  . Smoking status: Current Every Day Smoker    Packs/day: 0.00    Years: 13.00    Types: E-cigarettes  . Smokeless tobacco: Never Used  . Alcohol use No  . Drug use: No  . Sexual activity: Yes    Birth control/ protection: None   Other Topics Concern  . None   Social History Narrative  . None    Family History  Problem Relation Age of Onset  . Stroke Mother

## 2017-03-17 LAB — GC/CHLAMYDIA PROBE AMP
CHLAMYDIA, DNA PROBE: NEGATIVE
Neisseria gonorrhoeae by PCR: NEGATIVE

## 2017-03-18 ENCOUNTER — Emergency Department (HOSPITAL_COMMUNITY)
Admission: EM | Admit: 2017-03-18 | Discharge: 2017-03-18 | Disposition: A | Discharge status: Home / Self Care | Payer: Self-pay | Attending: Emergency Medicine | Admitting: Emergency Medicine

## 2017-03-18 DIAGNOSIS — Z5321 Procedure and treatment not carried out due to patient leaving prior to being seen by health care provider: Secondary | ICD-10-CM | POA: Insufficient documentation

## 2017-03-18 DIAGNOSIS — F1193 Opioid use, unspecified with withdrawal: Secondary | ICD-10-CM | POA: Insufficient documentation

## 2017-03-18 NOTE — ED Notes (Signed)
Pt not in waiting area or outside

## 2017-03-18 NOTE — ED Triage Notes (Signed)
Using heroin for over one year , wants detox, last used on Sunday

## 2017-03-18 NOTE — ED Notes (Signed)
Pt not in waiting area x 2 

## 2017-03-18 NOTE — ED Triage Notes (Signed)
Called pt to triage, not in waiting area

## 2017-03-19 ENCOUNTER — Encounter (HOSPITAL_COMMUNITY): Payer: Self-pay | Admitting: Emergency Medicine

## 2017-03-19 ENCOUNTER — Emergency Department (HOSPITAL_COMMUNITY)
Admission: EM | Admit: 2017-03-19 | Discharge: 2017-03-19 | Discharge status: Against Medical Advice | Payer: Self-pay | Attending: Emergency Medicine | Admitting: Emergency Medicine

## 2017-03-19 ENCOUNTER — Emergency Department (HOSPITAL_COMMUNITY)
Admission: EM | Admit: 2017-03-19 | Discharge: 2017-03-19 | Disposition: A | Discharge status: Home / Self Care | Payer: Self-pay | Attending: Emergency Medicine | Admitting: Emergency Medicine

## 2017-03-19 DIAGNOSIS — Z5321 Procedure and treatment not carried out due to patient leaving prior to being seen by health care provider: Secondary | ICD-10-CM | POA: Insufficient documentation

## 2017-03-19 DIAGNOSIS — F1110 Opioid abuse, uncomplicated: Secondary | ICD-10-CM | POA: Insufficient documentation

## 2017-03-19 DIAGNOSIS — Z7151 Drug abuse counseling and surveillance of drug abuser: Secondary | ICD-10-CM | POA: Insufficient documentation

## 2017-03-19 LAB — CBC WITH DIFFERENTIAL/PLATELET
Basophils Absolute: 0 10*3/uL (ref 0.0–0.1)
Basophils Relative: 0 %
EOS ABS: 0.1 10*3/uL (ref 0.0–0.7)
EOS PCT: 2 %
HCT: 38.6 % (ref 36.0–46.0)
Hemoglobin: 13 g/dL (ref 12.0–15.0)
LYMPHS ABS: 1.9 10*3/uL (ref 0.7–4.0)
Lymphocytes Relative: 26 %
MCH: 30.4 pg (ref 26.0–34.0)
MCHC: 33.7 g/dL (ref 30.0–36.0)
MCV: 90.2 fL (ref 78.0–100.0)
MONO ABS: 0.4 10*3/uL (ref 0.1–1.0)
MONOS PCT: 6 %
Neutro Abs: 4.8 10*3/uL (ref 1.7–7.7)
Neutrophils Relative %: 66 %
Platelets: 285 10*3/uL (ref 150–400)
RBC: 4.28 MIL/uL (ref 3.87–5.11)
RDW: 13.3 % (ref 11.5–15.5)
WBC: 7.2 10*3/uL (ref 4.0–10.5)

## 2017-03-19 LAB — URINALYSIS, ROUTINE W REFLEX MICROSCOPIC
Bilirubin Urine: NEGATIVE
GLUCOSE, UA: NEGATIVE mg/dL
HGB URINE DIPSTICK: NEGATIVE
Ketones, ur: NEGATIVE mg/dL
NITRITE: NEGATIVE
PROTEIN: NEGATIVE mg/dL
SPECIFIC GRAVITY, URINE: 1.024 (ref 1.005–1.030)
pH: 5 (ref 5.0–8.0)

## 2017-03-19 LAB — BASIC METABOLIC PANEL
Anion gap: 9 (ref 5–15)
BUN: 24 mg/dL — AB (ref 6–20)
CALCIUM: 9 mg/dL (ref 8.9–10.3)
CHLORIDE: 104 mmol/L (ref 101–111)
CO2: 24 mmol/L (ref 22–32)
CREATININE: 0.5 mg/dL (ref 0.44–1.00)
GFR calc non Af Amer: >60 mL/min (ref >60)
Glucose, Bld: 84 mg/dL (ref 65–99)
Potassium: 3.6 mmol/L (ref 3.5–5.1)
SODIUM: 137 mmol/L (ref 135–145)

## 2017-03-19 LAB — RAPID URINE DRUG SCREEN, HOSP PERFORMED
AMPHETAMINES: NOT DETECTED
BENZODIAZEPINES: NOT DETECTED
Barbiturates: NOT DETECTED
Cocaine: NOT DETECTED
OPIATES: POSITIVE — AB
TETRAHYDROCANNABINOL: NOT DETECTED

## 2017-03-19 NOTE — ED Notes (Signed)
Notified by registration that patient left.  

## 2017-03-19 NOTE — ED Notes (Signed)
Patient had lab work drawn while here earlier. Still needs urine sample.

## 2017-03-19 NOTE — ED Triage Notes (Addendum)
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 States last use was last night. Denies any withdrawal symptoms at triage. Patient denies SI/HI.

## 2017-03-19 NOTE — ED Triage Notes (Signed)
Patient here for rehab from crystal meth and heroin. Patient was triaged earlier today and left. States she went to get lunch, denies any drug use prior to returning to ER. Denies withdrawal symptoms.

## 2017-03-29 ENCOUNTER — Encounter: Payer: Self-pay | Admitting: *Deleted

## 2017-03-29 ENCOUNTER — Telehealth: Payer: Self-pay | Admitting: *Deleted

## 2017-03-29 ENCOUNTER — Ambulatory Visit: Payer: Self-pay | Admitting: Obstetrics & Gynecology

## 2017-03-29 NOTE — Telephone Encounter (Signed)
Patient called stating she received a letter in the mail from [facility] stating she was positive for chlamydia. Informed patient that per our records she tested negative. Advised patient to call [facility] to make sure they did not send to the wrong patient. Verbalized understanding.

## 2017-03-31 ENCOUNTER — Encounter (HOSPITAL_COMMUNITY): Payer: Self-pay | Admitting: Emergency Medicine

## 2017-03-31 ENCOUNTER — Emergency Department (HOSPITAL_COMMUNITY)
Admission: EM | Admit: 2017-03-31 | Discharge: 2017-03-31 | Disposition: A | Discharge status: Home / Self Care | Payer: Self-pay | Attending: Emergency Medicine | Admitting: Emergency Medicine

## 2017-03-31 DIAGNOSIS — F17290 Nicotine dependence, other tobacco product, uncomplicated: Secondary | ICD-10-CM | POA: Insufficient documentation

## 2017-03-31 DIAGNOSIS — E039 Hypothyroidism, unspecified: Secondary | ICD-10-CM | POA: Insufficient documentation

## 2017-03-31 DIAGNOSIS — F1910 Other psychoactive substance abuse, uncomplicated: Secondary | ICD-10-CM | POA: Insufficient documentation

## 2017-03-31 DIAGNOSIS — Z79899 Other long term (current) drug therapy: Secondary | ICD-10-CM | POA: Insufficient documentation

## 2017-03-31 HISTORY — DX: Other psychoactive substance abuse, uncomplicated: F19.10

## 2017-03-31 LAB — PREGNANCY, URINE: PREG TEST UR: NEGATIVE

## 2017-03-31 LAB — BASIC METABOLIC PANEL
ANION GAP: 7 (ref 5–15)
BUN: 18 mg/dL (ref 6–20)
CO2: 26 mmol/L (ref 22–32)
Calcium: 9.1 mg/dL (ref 8.9–10.3)
Chloride: 103 mmol/L (ref 101–111)
Creatinine, Ser: 0.67 mg/dL (ref 0.44–1.00)
GFR calc Af Amer: >60 mL/min (ref >60)
GFR calc non Af Amer: >60 mL/min (ref >60)
GLUCOSE: 96 mg/dL (ref 65–99)
POTASSIUM: 4 mmol/L (ref 3.5–5.1)
Sodium: 136 mmol/L (ref 135–145)

## 2017-03-31 LAB — URINALYSIS, ROUTINE W REFLEX MICROSCOPIC
BACTERIA UA: NONE SEEN
BILIRUBIN URINE: NEGATIVE
Glucose, UA: NEGATIVE mg/dL
HGB URINE DIPSTICK: NEGATIVE
KETONES UR: NEGATIVE mg/dL
Nitrite: NEGATIVE
PH: 5 (ref 5.0–8.0)
Protein, ur: NEGATIVE mg/dL
RBC / HPF: NONE SEEN RBC/hpf (ref 0–5)
Specific Gravity, Urine: 1.026 (ref 1.005–1.030)

## 2017-03-31 LAB — CBC WITH DIFFERENTIAL/PLATELET
BASOS ABS: 0 10*3/uL (ref 0.0–0.1)
Basophils Relative: 0 %
Eosinophils Absolute: 0.2 10*3/uL (ref 0.0–0.7)
Eosinophils Relative: 2 %
HEMATOCRIT: 40 % (ref 36.0–46.0)
Hemoglobin: 13.8 g/dL (ref 12.0–15.0)
LYMPHS PCT: 28 %
Lymphs Abs: 2.3 10*3/uL (ref 0.7–4.0)
MCH: 31.2 pg (ref 26.0–34.0)
MCHC: 34.5 g/dL (ref 30.0–36.0)
MCV: 90.3 fL (ref 78.0–100.0)
MONO ABS: 0.5 10*3/uL (ref 0.1–1.0)
Monocytes Relative: 6 %
NEUTROS ABS: 5.3 10*3/uL (ref 1.7–7.7)
Neutrophils Relative %: 64 %
Platelets: 269 10*3/uL (ref 150–400)
RBC: 4.43 MIL/uL (ref 3.87–5.11)
RDW: 13.4 % (ref 11.5–15.5)
WBC: 8.2 10*3/uL (ref 4.0–10.5)

## 2017-03-31 LAB — RAPID URINE DRUG SCREEN, HOSP PERFORMED
Amphetamines: POSITIVE — AB
BARBITURATES: NOT DETECTED
Benzodiazepines: NOT DETECTED
COCAINE: NOT DETECTED
Opiates: POSITIVE — AB
TETRAHYDROCANNABINOL: NOT DETECTED

## 2017-03-31 LAB — ETHANOL: Alcohol, Ethyl (B): <10 mg/dL (ref <10)

## 2017-03-31 MED ORDER — ACETAMINOPHEN 325 MG PO TABS
650.0000 mg | ORAL_TABLET | Freq: Once | ORAL | Status: AC
  Administered 2017-03-31: 650 mg via ORAL
  Filled 2017-03-31: qty 2

## 2017-03-31 NOTE — ED Notes (Signed)
Pt ambulatory to waiting room. Pt verbalized understanding of discharge instructions.   

## 2017-03-31 NOTE — ED Triage Notes (Signed)
Pt states she wants detox from crystal meth and heroin. Pt states she has had thoughts about suicide in the past but not today. She denies any Hi ideations at this time.

## 2017-03-31 NOTE — Discharge Instructions (Signed)
Substance Abuse Treatment Programs ° °Intensive Outpatient Programs °[facility]     °[address]      °[place], [place]                   °[phone]      ° °[facility] °[address] °[place], [place] °[phone] ° °[facility]     °(Inpatient and outpatient)     °[address]           °[phone]   ° °[facility] °336-288-1484 (Suboxone and Methadone) ° °[address]      °[place], [place] [address]      °336-882-2125      ° °[address] °[place], [place] °852-3033 ° °[facility] (Outpatient/Inpatient, Chemical)    °(insurance only) [phone]      °       °[facility] (Groups & Residential) °[place], [place] °[phone] ° °   °[facility]     °[address]     °[place], [place]      °[phone]      ° °[facility] (for caregivers and family) °[address] °[place], [place] °336-299-4655 ° ° ° ° ° °Residential Treatment Programs °[facility]      °[address], [place], [place] [address]  °[phone]      ° °[facility] °[address] [place], [place] [address] °[phone] ° °[facility]        °336-248-8914      ° °[facility] °1-800-659-3381 ° °[facility]             °[address]                                         °[place], [place]                                                °877-615-2722 or 336-784-9470                              ° °[facility] [place] °[address] °[place], [address] °1.877.941.8954 ° °[facility]    °[address]      °, [place]     °336-273-5306      ° °[facility] °[address] °, [place] °336-285-9073 ° °[facility]   °[address]     °[place], [place] [address]     °336-899-1550      °Admissions: 8am-3pm M-F ° °[facility] °[address] °[place] °336-227-7417 ° °[facility]: Residential Program (90 Days)   °[place], [place]      °336-725-8389 or 800-758-6077    ° °[facility] °[place], [place] °(Walk in Hours over the weekend or by referral) ° °[facility] °[address], [place], [place] [address] °(336) [phone] ° °Crisis Mobile: [facility]:  1-877-626-1772 (for crisis response 24 hours a day) °[facility] Hotline:      1-800-256-2452 °Outpatient Psychiatry and Counseling ° °[facility]: Mobile Crisis   Management 24 hours:  1-877-626-1772 ° °[facility] sliding scale fee and walk in schedule: M-F 8am-12pm/1pm-3pm °[address]  °[place], [place] [address] °336-387-6161 ° °[facility] °[address] °[place], [place] [address] °336-703-9650 ° °[facility] ([facility])- new patient walk-in appointments available Monday - Friday 8am -3pm.          °[address] °[place], [place] [address] °336-676-6840 or crisis line- 336-676-6905 ° °[facility] Intensive [facility]patient Therapy Program °[address] °[place], [place] [address] °336-832-9804 ° °[facility]                  °Crisis Services      °336.641.4993      °[address]     °[place], [place] [address]                ° °[facility]   °[facility] °[phone] °[address] °[place], [place] [address] ° ° °[facility]          °[address]  °[place], [place] [address]       °(336) [phone] ° °[facility] °[address] °[place], [place] [address] °336-292-1510 ° °[facility]    °[address]    °[place], [place] [address]     °336-632-3505      ° °Parish McKinney, MD     °[address]     °[place] [address]     °336-282-1251     °  °[facility] °[address] °[place] [address] ° °[facility]     °[address]     °[place], [place]      °336-542-2076      ° °[facility] °Shamsher Ahluwalia, MD °[address] °[place], [place] [address] °336-420-9558 ° °[facility]     °[address]     °[place], [place] [address]     °336-274-1237      ° °[facility] °[address] °[place], [place] [address] °336-854-4450 °Resources for Temporary Residential Assistance/Crisis Centers ° °DAY CENTERS °[facility] °M-F 8am-3pm   °[address] [place], [place] [address]   336-332-0824 °Services include: laundry, barbering, support groups, case management, phone  & computer access, showers, AA/NA mtgs, mental health/substance abuse nurse, job skills class, disability information, VA assistance, spiritual classes, etc.  ° °HOMELESS SHELTERS ° °[facility]     °[facility]   °[address], [place]     °336.271.5959       °       °[facility] (women and children)       °[address] °[place], [place] [address] °336-275-0820 °[email or web address] for application and process °Application Required ° °[facility]   °[address]    °[place] [address]     °336.886.4922       °             °[facility] °[address] °[place], [place] [address] °336.273.5572 °336-235-0363(schedule application appt.) °Application Required ° °[facility] (women only)    °[address]     °[place], [place] [address]     °336-884-1039      °  Intake starts 6pm daily Need valid ID, SSC, & Police report [facility] [place] [address] [place] [phone] Application Required  [facility] (men only)     [address]      [place], [place]     [phone]       [facility] (Pregnant women only) [address] [place], [place] [phone]  [facility]      [address]      [place], [place] [address]     [phone]             [facility] [address] [place], [place] [phone] 90 day commitment/SA/Application process  [facility](men only)     [address]     [place]     [phone]       Check-in at [facility]pm            [facility] [address] [place], [place] [phone] Men/Women/Women and Children must be there by 7 pm  [facility] [place] [phone]                  Take your usual prescriptions as previously directed.  Call your regular medical doctor and the detox resources given to you today to schedule a follow up appointment within the week.  Return to the Emergency Department immediately sooner if worsening.

## 2017-03-31 NOTE — ED Provider Notes (Signed)
[facility] EMERGENCY DEPARTMENT Provider Note   CSN: [id number] Arrival date & time: 03/31/17  1906     History   Chief Complaint Chief Complaint  Patient presents with  . Medical Clearance    HPI Nina Herrera is a 31 y.o. female.     Pt was seen at 2015.  Per pt, c/o gradual onset and persistence of constant polysubstance abuse for the past several months. Pt states she "wants detox" from crystal meth and heroin. Denies withdrawal symptoms at this time. Denies SI/SA, no HI, no hallucinations. Denies CP/SOB, no abd pain, no N/V/D, no fevers.   Past Medical History:  Diagnosis Date  . Hypothyroidism    age 66,   . Polysubstance abuse [facility])     Patient Active Problem List   Diagnosis Date Noted  . Polysubstance abuse (HCC) 02/18/2016  . Opioid dependence (HCC) 02/12/2016  . Hypothyroidism   . GAD (generalized anxiety disorder) 09/02/2015  . Irregular menses 06/30/2011  . Hair loss 06/30/2011  . Brittle nails 06/30/2011  . Autoimmune thyroiditis 06/30/2011  . Depression 06/30/2011    Past Surgical History:  Procedure Laterality Date  . NO PAST SURGERIES      OB History    Gravida Para Term Preterm AB Living   2 2 2     1    SAB TAB Ectopic Multiple Live Births         0 2       Home Medications    Prior to Admission medications   Medication Sig Start Date End Date Taking? Authorizing Provider  desogestrel-ethinyl estradiol (APRI,EMOQUETTE,SOLIA) 0.15-30 MG-MCG tablet Take 1 tablet by mouth daily. 03/15/17  Yes Lazaro Arms, MD  doxycycline (VIBRA-TABS) 100 MG tablet Take 1 tablet (100 mg total) by mouth 2 (two) times daily. 03/15/17  Yes Lazaro Arms, MD    Family History Family History  Problem Relation Age of Onset  . Stroke Mother     Social History Social History  Substance Use Topics  . Smoking status: Current Every Day Smoker    Packs/day: 0.00    Years: 13.00    Types: E-cigarettes  . Smokeless tobacco: Never Used  . Alcohol use  No     Allergies   Patient has no known allergies.   Review of Systems Review of Systems ROS: Statement: All systems negative except as marked or noted in the HPI; Constitutional: Negative for fever and chills. ; ; Eyes: Negative for eye pain, redness and discharge. ; ; ENMT: Negative for ear pain, hoarseness, nasal congestion, sinus pressure and sore throat. ; ; Cardiovascular: Negative for chest pain, palpitations, diaphoresis, dyspnea and peripheral edema. ; ; Respiratory: Negative for cough, wheezing and stridor. ; ; Gastrointestinal: Negative for nausea, vomiting, diarrhea, abdominal pain, blood in stool, hematemesis, jaundice and rectal bleeding. ; ; Genitourinary: Negative for dysuria, flank pain and hematuria. ; ; Musculoskeletal: Negative for back pain and neck pain. Negative for swelling and trauma.; ; Skin: Negative for pruritus, rash, abrasions, blisters, bruising and skin lesion.; ; Neuro: Negative for headache, lightheadedness and neck stiffness. Negative for weakness, altered level of consciousness, altered mental status, extremity weakness, paresthesias, involuntary movement, seizure and syncope. ; Psych:  No SI, no SA, no HI, no hallucinations.     Physical Exam Updated Vital Signs BP 104/83 (BP Location: Right Arm)   Pulse (!) 125   Temp 98.6 F (37 C) (Oral)   Resp 16   Ht 5\' 5"  (1.651 m)  Wt 49.9 kg (110 lb)   LMP 03/08/2017   SpO2 99%   BMI 18.30 kg/m    BP 116/79 (BP Location: Right Arm)   Pulse 100   Temp 98.2 F (36.8 C) (Oral)   Resp 16   Ht 5\' 5"  (1.651 m)   Wt 49.9 kg (110 lb)   LMP 03/08/2017   SpO2 100%   BMI 18.30 kg/m    Physical Exam 2020: Physical examination:  Nursing notes reviewed; Vital signs and O2 SAT reviewed;  Constitutional: Well developed, Well nourished, Well hydrated, In no acute distress; Head:  Normocephalic, atraumatic; Eyes: EOMI, PERRL, No scleral icterus; ENMT: Mouth and pharynx normal, Mucous membranes moist; Neck:  Supple, Full range of motion, No lymphadenopathy; Cardiovascular: Regular rate and rhythm, No gallop; Respiratory: Breath sounds clear & equal bilaterally, No wheezes.  Speaking full sentences with ease, Normal respiratory effort/excursion; Chest: Nontender, Movement normal; Abdomen: Soft, Nontender, Nondistended, Normal bowel sounds; Genitourinary: No CVA tenderness; Extremities: Pulses normal, No tenderness, No edema, No calf edema or asymmetry.; Neuro: AA&Ox3, Major CN grossly intact.  Speech clear. No gross focal motor or sensory deficits in extremities.; Skin: Color normal, Warm, Dry.; Psych:  Affect flat, poor eye contact. Denies SI.     ED Treatments / Results  Labs (all labs ordered are listed, but only abnormal results are displayed)   EKG  EKG Interpretation None       Radiology   Procedures Procedures (including critical care time)  Medications Ordered in ED Medications  acetaminophen (TYLENOL) tablet 650 mg (650 mg Oral Given 03/31/17 2041)     Initial Impression / Assessment and Plan / ED Course  I have reviewed the triage vital signs and the nursing notes.  Pertinent labs & imaging results that were available during my care of the patient were reviewed by me and considered in my medical decision making (see chart for details).  MDM Reviewed: previous chart, nursing note and vitals Reviewed previous: labs Interpretation: labs   Results for orders placed or performed during the hospital encounter of 03/31/17  CBC with Differential  Result Value Ref Range   WBC 8.2 4.0 - 10.5 K/uL   RBC 4.43 3.87 - 5.11 MIL/uL   Hemoglobin 13.8 12.0 - 15.0 g/dL   HCT 16.1 09.6 - 04.5 %   MCV 90.3 78.0 - 100.0 fL   MCH 31.2 26.0 - 34.0 pg   MCHC 34.5 30.0 - 36.0 g/dL   RDW 40.9 81.1 - 91.4 %   Platelets 269 150 - 400 K/uL   Neutrophils Relative % 64 %   Neutro Abs 5.3 1.7 - 7.7 K/uL   Lymphocytes Relative 28 %   Lymphs Abs 2.3 0.7 - 4.0 K/uL   Monocytes Relative 6 %    Monocytes Absolute 0.5 0.1 - 1.0 K/uL   Eosinophils Relative 2 %   Eosinophils Absolute 0.2 0.0 - 0.7 K/uL   Basophils Relative 0 %   Basophils Absolute 0.0 0.0 - 0.1 K/uL  Basic metabolic panel  Result Value Ref Range   Sodium 136 135 - 145 mmol/L   Potassium 4.0 3.5 - 5.1 mmol/L   Chloride 103 101 - 111 mmol/L   CO2 26 22 - 32 mmol/L   Glucose, Bld 96 65 - 99 mg/dL   BUN 18 6 - 20 mg/dL   Creatinine, Ser 7.82 0.44 - 1.00 mg/dL   Calcium 9.1 8.9 - 95.6 mg/dL   GFR calc non Af Amer >60 >60 mL/min  GFR calc Af Amer >60 >60 mL/min   Anion gap 7 5 - 15  Urinalysis, Routine w reflex microscopic  Result Value Ref Range   Color, Urine YELLOW YELLOW   APPearance HAZY (A) CLEAR   Specific Gravity, Urine 1.026 1.005 - 1.030   pH 5.0 5.0 - 8.0   Glucose, UA NEGATIVE NEGATIVE mg/dL   Hgb urine dipstick NEGATIVE NEGATIVE   Bilirubin Urine NEGATIVE NEGATIVE   Ketones, ur NEGATIVE NEGATIVE mg/dL   Protein, ur NEGATIVE NEGATIVE mg/dL   Nitrite NEGATIVE NEGATIVE   Leukocytes, UA TRACE (A) NEGATIVE   RBC / HPF NONE SEEN 0 - 5 RBC/hpf   WBC, UA 0-5 0 - 5 WBC/hpf   Bacteria, UA NONE SEEN NONE SEEN   Squamous Epithelial / LPF 0-5 (A) NONE SEEN   Mucus PRESENT   Rapid urine drug screen (hospital performed)  Result Value Ref Range   Opiates POSITIVE (A) NONE DETECTED   Cocaine NONE DETECTED NONE DETECTED   Benzodiazepines NONE DETECTED NONE DETECTED   Amphetamines POSITIVE (A) NONE DETECTED   Tetrahydrocannabinol NONE DETECTED NONE DETECTED   Barbiturates NONE DETECTED NONE DETECTED  Ethanol  Result Value Ref Range   Alcohol, Ethyl (B) <10 <10 mg/dL  Pregnancy, urine  Result Value Ref Range   Preg Test, Ur NEGATIVE NEGATIVE    2120:  Pt asked for "some tylenol" for her chronic LBP. Denies withdrawal symptoms when repeatedly asked. Also continues to deny SI/HI, and she does not appear to be responding to internal stimuli. Will give outpatient detox resources. Dx and testing d/w pt and  family.  Questions answered.  Verb understanding, agreeable to d/c home with outpt f/u.    Final Clinical Impressions(s) / ED Diagnoses   Final diagnoses:  None    New Prescriptions New Prescriptions   No medications on file      Samuel JesterMcManus, Merion Grimaldo, DO 04/04/17 1305

## 2017-03-31 NOTE — ED Notes (Addendum)
Pt reports using crystal meth and heroine. Pt stated she used heroine "yesterday." Pt denies HI/SI. Pt denies auditory or visual hallucinations.

## 2017-04-15 ENCOUNTER — Ambulatory Visit: Payer: Self-pay | Admitting: Obstetrics & Gynecology

## 2017-04-27 ENCOUNTER — Encounter: Payer: Self-pay | Admitting: Obstetrics & Gynecology

## 2017-04-27 ENCOUNTER — Ambulatory Visit: Payer: Self-pay | Admitting: Obstetrics & Gynecology

## 2017-05-21 ENCOUNTER — Ambulatory Visit: Payer: Self-pay | Admitting: Physician Assistant

## 2017-06-29 ENCOUNTER — Encounter: Payer: Self-pay | Admitting: Physician Assistant

## 2017-06-29 ENCOUNTER — Other Ambulatory Visit: Payer: Self-pay

## 2017-06-29 ENCOUNTER — Ambulatory Visit: Payer: Self-pay | Admitting: Physician Assistant

## 2017-06-29 VITALS — BP 110/68 | HR 98 | Temp 98.1°F | Resp 18 | Ht 65.0 in | Wt 113.4 lb

## 2017-06-29 DIAGNOSIS — F4321 Adjustment disorder with depressed mood: Secondary | ICD-10-CM

## 2017-06-29 DIAGNOSIS — F411 Generalized anxiety disorder: Secondary | ICD-10-CM

## 2017-06-29 MED ORDER — HYDROXYZINE HCL 25 MG PO TABS: 12.5000 mg | ORAL_TABLET | Freq: Three times a day (TID) | ORAL | 0 refills | Status: AC | PRN

## 2017-06-29 NOTE — Progress Notes (Signed)
Nina Herrera  MRN: [id number] DOB: March 09, 1986  PCP: Patient, No Pcp Per  Chief Complaint  Patient presents with  . Medication Refill    pt would like a rx for xanax due to home issues   . Depression    screening was a 22     Subjective:  Pt presents to clinic for grief reaction.  Her 224 month old son suffered from a crib death.  He was well.  She also has a 30 year old.  Other around her seem to be getting better but she is not.  She has a hard time leaving the house because she does not want to leave him.  She is able to do stuff around the house but some days she sleeps all day and she is having trouble eating.  Pt wants a refill of Xanax as she has been on that in the past and has helped with her anxiety.  Sleep - sleeps to much ETOH -  None (past heavy drinker) Drugs -  None currently - she has had problems in the past with drug overuse. Appetite - decreased - no hungry - weight loss per patient though per chart there has been a weight gain in the last 3 months  History is obtained by patient.  Review of Systems  Psychiatric/Behavioral: Positive for dysphoric mood and sleep disturbance. The patient is nervous/anxious.     Patient Active Problem List   Diagnosis Date Noted  . Polysubstance abuse (HCC) 02/18/2016  . Opioid dependence (HCC) 02/12/2016  . Hypothyroidism   . GAD (generalized anxiety disorder) 09/02/2015  . Irregular menses 06/30/2011  . Hair loss 06/30/2011  . Brittle nails 06/30/2011  . Autoimmune thyroiditis 06/30/2011  . Depression 06/30/2011    Current Outpatient Medications on File Prior to Visit  Medication Sig Dispense Refill  . desogestrel-ethinyl estradiol (APRI,EMOQUETTE,SOLIA) 0.15-30 MG-MCG tablet Take 1 tablet by mouth daily. 1 Package 11   No current facility-administered medications on file prior to visit.     No Known Allergies  Past Medical History:  Diagnosis Date  . Hypothyroidism    age 32,   . Polysubstance abuse [facility](HCC)     Social History   Social History Narrative  . Not on file   Social History   Tobacco Use  . Smoking status: Current Every Day Smoker    Packs/day: 0.00    Years: 13.00    Pack years: 0.00    Types: E-cigarettes  . Smokeless tobacco: Never Used  Substance Use Topics  . Alcohol use: No  . Drug use: Yes    Types: Methamphetamines    Comment: heroin and crystal meth   family history includes Stroke in her mother.     Objective:  BP 110/68   Pulse 98   Temp 98.1 F (36.7 C) (Oral)   Resp 18   Ht 5\' 5"  (1.651 m)   Wt 113 lb 6.4 oz (51.4 kg)   LMP 05/30/2017   SpO2 100%   BMI 18.87 kg/m  Body mass index is 18.87 kg/m.  Physical Exam  Constitutional: She is oriented to person, place, and time and well-developed, well-nourished, and in no distress.  HENT:  Head: Normocephalic and atraumatic.  Right Ear: Hearing and external ear normal.  Left Ear: Hearing and external ear normal.  Eyes: Conjunctivae are normal.  Neck: Normal range of motion.  Cardiovascular: Normal rate, regular rhythm and normal heart sounds.  No murmur heard. Pulmonary/Chest: Effort normal and breath sounds  normal. She has no wheezes.  Neurological: She is alert and oriented to person, place, and time. Gait normal.  Skin: Skin is warm and dry.  Psychiatric: Mood, memory, affect and judgment normal.  Tearful at times  Vitals reviewed.   Assessment and Plan :  Grief reaction - Plan: hydrOXYzine (ATARAX/VISTARIL) 25 MG tablet  Anxiety state - Plan: hydrOXYzine (ATARAX/VISTARIL) 25 MG tablet   Pt has h/o polysubstance abuse - she told me it was in the past but according to her chart she was last seen in 03/2017.  Pt was agreeable to try something that is not habit forming - she will start with atarax and then continue with her plan for therapy.  She will see me in a month - a refill of medication may be needed based on the dose that she needs for symptoms control.  Due to the timing we will not  start SSRI today but wait to see how therapy will help her.  She has a referral to someone in [place] in 2 weeks.  She is ready to start healing from this.  She was not working at the time and currently her goal is to be able to function better.    Benny Lennert PA-C  Primary Care at [facility] 06/29/2017 3:21 PM

## 2017-06-29 NOTE — Patient Instructions (Signed)
     IF you received an x-ray today, you will receive an invoice from [facility]. Please contact [facility] at [phone] with questions or concerns regarding your invoice.   IF you received labwork today, you will receive an invoice from [facility]. Please contact [facility] at [phone] with questions or concerns regarding your invoice.   Our billing staff will not be able to assist you with questions regarding bills from these companies.  You will be contacted with the lab results as soon as they are available. The fastest way to get your results is to activate your My Chart account. Instructions are located on the last page of this paperwork. If you have not heard from us regarding the results in 2 weeks, please contact this office.     

## 2017-09-07 ENCOUNTER — Emergency Department (HOSPITAL_COMMUNITY)
Admission: EM | Admit: 2017-09-07 | Discharge: 2017-09-07 | Disposition: A | Discharge status: Home / Self Care | Payer: Self-pay | Attending: Emergency Medicine | Admitting: Emergency Medicine

## 2017-09-07 ENCOUNTER — Emergency Department (HOSPITAL_COMMUNITY): Payer: Self-pay

## 2017-09-07 ENCOUNTER — Other Ambulatory Visit: Payer: Self-pay

## 2017-09-07 ENCOUNTER — Encounter (HOSPITAL_COMMUNITY): Payer: Self-pay | Admitting: Emergency Medicine

## 2017-09-07 DIAGNOSIS — F17290 Nicotine dependence, other tobacco product, uncomplicated: Secondary | ICD-10-CM | POA: Insufficient documentation

## 2017-09-07 DIAGNOSIS — E039 Hypothyroidism, unspecified: Secondary | ICD-10-CM | POA: Insufficient documentation

## 2017-09-07 DIAGNOSIS — R110 Nausea: Secondary | ICD-10-CM | POA: Insufficient documentation

## 2017-09-07 DIAGNOSIS — F1110 Opioid abuse, uncomplicated: Secondary | ICD-10-CM | POA: Insufficient documentation

## 2017-09-07 DIAGNOSIS — Z79899 Other long term (current) drug therapy: Secondary | ICD-10-CM | POA: Insufficient documentation

## 2017-09-07 DIAGNOSIS — M542 Cervicalgia: Secondary | ICD-10-CM | POA: Insufficient documentation

## 2017-09-07 DIAGNOSIS — R21 Rash and other nonspecific skin eruption: Secondary | ICD-10-CM | POA: Insufficient documentation

## 2017-09-07 DIAGNOSIS — R0789 Other chest pain: Secondary | ICD-10-CM | POA: Insufficient documentation

## 2017-09-07 LAB — COMPREHENSIVE METABOLIC PANEL
ALT: 17 U/L (ref 14–54)
ANION GAP: 9 (ref 5–15)
AST: 21 U/L (ref 15–41)
Albumin: 4.2 g/dL (ref 3.5–5.0)
Alkaline Phosphatase: 43 U/L (ref 38–126)
BUN: 16 mg/dL (ref 6–20)
CHLORIDE: 100 mmol/L — AB (ref 101–111)
CO2: 26 mmol/L (ref 22–32)
CREATININE: 0.59 mg/dL (ref 0.44–1.00)
Calcium: 9.3 mg/dL (ref 8.9–10.3)
GFR calc non Af Amer: >60 mL/min (ref >60)
Glucose, Bld: 112 mg/dL — ABNORMAL HIGH (ref 65–99)
POTASSIUM: 4.2 mmol/L (ref 3.5–5.1)
SODIUM: 135 mmol/L (ref 135–145)
Total Bilirubin: 0.4 mg/dL (ref 0.3–1.2)
Total Protein: 7.2 g/dL (ref 6.5–8.1)

## 2017-09-07 LAB — CBC WITH DIFFERENTIAL/PLATELET
BASOS ABS: 0 10*3/uL (ref 0.0–0.1)
Basophils Relative: 0 %
EOS PCT: 3 %
Eosinophils Absolute: 0.2 10*3/uL (ref 0.0–0.7)
HCT: 37.7 % (ref 36.0–46.0)
HEMOGLOBIN: 12.8 g/dL (ref 12.0–15.0)
LYMPHS PCT: 32 %
Lymphs Abs: 1.6 10*3/uL (ref 0.7–4.0)
MCH: 30.1 pg (ref 26.0–34.0)
MCHC: 34 g/dL (ref 30.0–36.0)
MCV: 88.7 fL (ref 78.0–100.0)
Monocytes Absolute: 0.3 10*3/uL (ref 0.1–1.0)
Monocytes Relative: 7 %
NEUTROS ABS: 3 10*3/uL (ref 1.7–7.7)
NEUTROS PCT: 58 %
PLATELETS: 240 10*3/uL (ref 150–400)
RBC: 4.25 MIL/uL (ref 3.87–5.11)
RDW: 11.8 % (ref 11.5–15.5)
WBC: 5.1 10*3/uL (ref 4.0–10.5)

## 2017-09-07 LAB — D-DIMER, QUANTITATIVE: D-Dimer, Quant: <0.27 ug/mL-FEU (ref 0.00–0.50)

## 2017-09-07 LAB — TROPONIN I

## 2017-09-07 MED ORDER — ONDANSETRON 8 MG PO TBDP
8.0000 mg | ORAL_TABLET | Freq: Once | ORAL | Status: AC
  Administered 2017-09-07: 8 mg via ORAL
  Filled 2017-09-07: qty 1

## 2017-09-07 MED ORDER — ASPIRIN 325 MG PO TABS
325.0000 mg | ORAL_TABLET | Freq: Once | ORAL | Status: DC
  Filled 2017-09-07: qty 1

## 2017-09-07 NOTE — Discharge Instructions (Addendum)
Your tests tonight look good, no pneumonia, heart attack, blood clots in your lungs. Please look at the resource guide to get outpatient treatment for your heroin addiction.

## 2017-09-07 NOTE — ED Triage Notes (Signed)
Pt c/o chest pain and heart racing x 2 hours.

## 2017-09-07 NOTE — ED Provider Notes (Signed)
[facility] EMERGENCY DEPARTMENT Provider Note   CSN: [id number] Arrival date & time: 09/07/17  0145  Time seen 02:22 AM   History   Chief Complaint Chief Complaint  Patient presents with  . Chest Pain    HPI Nina Herrera is a 32 y.o. female.  HPI patient has a history of polysubstance abuse and states she has been doing heroin for the past 2 years.  She states she went to rehab in December for 2 weeks at [place] and was only sober 1 week when she got out because she went back to her old friends.  She states she snorts the hair when she does not use IV.  She states she last started about 4 PM.  She states about 8:30 PM she had acute onset of left upper chest pain that she described as tightness with a feeling of her heart racing.  She states it had been there constantly however it has left since she got to the ER.  She denies shortness of breath, nausea, vomiting, lightheadedness, dizziness, or radiation of the pain.  She states she is never had it before.  She states the back of her neck hurts.  PCP Patient, No Pcp Per   Past Medical History:  Diagnosis Date  . Hypothyroidism    age 67,   . Polysubstance abuse [facility])     Patient Active Problem List   Diagnosis Date Noted  . Polysubstance abuse (HCC) 02/18/2016  . Opioid dependence (HCC) 02/12/2016  . Hypothyroidism   . GAD (generalized anxiety disorder) 09/02/2015  . Irregular menses 06/30/2011  . Hair loss 06/30/2011  . Brittle nails 06/30/2011  . Autoimmune thyroiditis 06/30/2011  . Depression 06/30/2011    Past Surgical History:  Procedure Laterality Date  . NO PAST SURGERIES       OB History    Gravida  2   Para  2   Term  2   Preterm      AB      Living  1     SAB      TAB      Ectopic      Multiple  0   Live Births  2            Home Medications    Prior to Admission medications   Medication Sig Start Date End Date Taking? Authorizing Provider  desogestrel-ethinyl estradiol  (APRI,EMOQUETTE,SOLIA) 0.15-30 MG-MCG tablet Take 1 tablet by mouth daily. 03/15/17   Lazaro Arms, MD  hydrOXYzine (ATARAX/VISTARIL) 25 MG tablet Take 0.5-2 tablets (12.5-50 mg total) by mouth 3 (three) times daily as needed for itching. 06/29/17   Valarie Cones, Dema Severin, PA-C    Family History Family History  Problem Relation Age of Onset  . Stroke Mother     Social History Social History   Tobacco Use  . Smoking status: Current Every Day Smoker    Packs/day: 0.00    Years: 13.00    Pack years: 0.00    Types: E-cigarettes  . Smokeless tobacco: Never Used  Substance Use Topics  . Alcohol use: No  . Drug use: Yes    Types: Methamphetamines    Comment: heroin and crystal meth  unemployed, states her boyfriend buys her drugs for her Smokes 1 ppd   Allergies   Patient has no known allergies.   Review of Systems Review of Systems  All other systems reviewed and are negative.    Physical Exam Updated Vital Signs BP 117/90  Pulse (!) 102   Temp (!) 97.5 F (36.4 C)   Resp 12   Ht 5\' 6"  (1.676 m)   Wt 51.3 kg (113 lb)   LMP 07/26/2017   SpO2 100%   BMI 18.24 kg/m   Vital signs normal borderline tachycardia   Physical Exam  Constitutional: She is oriented to person, place, and time.  Non-toxic appearance. She appears ill. No distress.  Underweight  HENT:  Head: Normocephalic and atraumatic.  Right Ear: External ear normal.  Left Ear: External ear normal.  Nose: Nose normal. No mucosal edema or rhinorrhea.  Mouth/Throat: Oropharynx is clear and moist and mucous membranes are normal. No dental abscesses or uvula swelling.  Eyes: Pupils are equal, round, and reactive to light. Conjunctivae and EOM are normal.  Neck: Normal range of motion and full passive range of motion without pain. Neck supple.  Cardiovascular: Normal rate, regular rhythm and normal heart sounds. Exam reveals no gallop and no friction rub.  No murmur heard. Pulmonary/Chest: Effort normal and  breath sounds normal. No respiratory distress. She has no wheezes. She has no rhonchi. She has no rales. She exhibits no tenderness and no crepitus.  Area of chest pain noted    Abdominal: Soft. Normal appearance and bowel sounds are normal. She exhibits no distension. There is no tenderness. There is no rebound and no guarding.  Musculoskeletal: Normal range of motion. She exhibits no edema or tenderness.  Moves all extremities well.   Neurological: She is alert and oriented to person, place, and time. She has normal strength. No cranial nerve deficit.  Skin: Skin is warm, dry and intact. Rash noted. No erythema. No pallor.  Has acne on her face  Psychiatric: Her affect is labile. Her speech is delayed.  Starts crying because she has difficulty answering questions about her symptoms  Nursing note and vitals reviewed.    ED Treatments / Results  Labs (all labs ordered are listed, but only abnormal results are displayed) Results for orders placed or performed during the hospital encounter of 09/07/17  Comprehensive metabolic panel  Result Value Ref Range   Sodium 135 135 - 145 mmol/L   Potassium 4.2 3.5 - 5.1 mmol/L   Chloride 100 (L) 101 - 111 mmol/L   CO2 26 22 - 32 mmol/L   Glucose, Bld 112 (H) 65 - 99 mg/dL   BUN 16 6 - 20 mg/dL   Creatinine, Ser 4.090.59 0.44 - 1.00 mg/dL   Calcium 9.3 8.9 - 81.110.3 mg/dL   Total Protein 7.2 6.5 - 8.1 g/dL   Albumin 4.2 3.5 - 5.0 g/dL   AST 21 15 - 41 U/L   ALT 17 14 - 54 U/L   Alkaline Phosphatase 43 38 - 126 U/L   Total Bilirubin 0.4 0.3 - 1.2 mg/dL   GFR calc non Af Amer >60 >60 mL/min   GFR calc Af Amer >60 >60 mL/min   Anion gap 9 5 - 15  Troponin I  Result Value Ref Range   Troponin I <0.03 <0.03 ng/mL  CBC with Differential  Result Value Ref Range   WBC 5.1 4.0 - 10.5 K/uL   RBC 4.25 3.87 - 5.11 MIL/uL   Hemoglobin 12.8 12.0 - 15.0 g/dL   HCT 91.437.7 78.236.0 - 95.646.0 %   MCV 88.7 78.0 - 100.0 fL   MCH 30.1 26.0 - 34.0 pg   MCHC 34.0 30.0  - 36.0 g/dL   RDW 21.311.8 08.611.5 - 57.815.5 %   Platelets  240 150 - 400 K/uL   Neutrophils Relative % 58 %   Neutro Abs 3.0 1.7 - 7.7 K/uL   Lymphocytes Relative 32 %   Lymphs Abs 1.6 0.7 - 4.0 K/uL   Monocytes Relative 7 %   Monocytes Absolute 0.3 0.1 - 1.0 K/uL   Eosinophils Relative 3 %   Eosinophils Absolute 0.2 0.0 - 0.7 K/uL   Basophils Relative 0 %   Basophils Absolute 0.0 0.0 - 0.1 K/uL  D-dimer, quantitative  Result Value Ref Range   D-Dimer, Quant <0.27 0.00 - 0.50 ug/mL-FEU   Laboratory interpretation all normal     EKG EKG Interpretation  Date/Time:  Tuesday September 07 2017 02:16:27 EDT Ventricular Rate:  96 PR Interval:    QRS Duration: 95 QT Interval:  356 QTC Calculation: 450 R Axis:   83 Text Interpretation:  Sinus rhythm Normal ECG No old tracing to compare Confirmed by Devoria Albe ([id number]) on 09/07/2017 2:53:27 AM   Radiology Dg Chest 2 View  Result Date: 09/07/2017 CLINICAL DATA:  32 y/o  F; chest pain and heart racing for 2 hours. EXAM: CHEST - 2 VIEW COMPARISON:  None. FINDINGS: Normal cardiac silhouette. Hyperinflated lungs and flattened diaphragms. Clear lungs. No pleural effusion or pneumothorax. No acute osseous abnormality is evident. IMPRESSION: Hyperinflated lungs.  No acute pulmonary process identified. Electronically Signed   By: Mitzi Hansen M.D.   On: 09/07/2017 05:09    Procedures Procedures (including critical care time)  Medications Ordered in ED Medications  aspirin tablet 325 mg (325 mg Oral Not Given 09/07/17 0305)  ondansetron (ZOFRAN-ODT) disintegrating tablet 8 mg (has no administration in time range)     Initial Impression / Assessment and Plan / ED Course  I have reviewed the triage vital signs and the nursing notes.  Pertinent labs & imaging results that were available during my care of the patient were reviewed by me and considered in my medical decision making (see chart for details).     Patient is currently pain-free.   Laboratory testing and chest x-rays were done.  She was advised to let the nursing staff know if she gets chest pain again.  Unfortunately I had another patient with chest pain and I forgot to put this patient's orders in in a timely manner.  I thought I had already done it.  I was going to give her aspirin 08/24/1948 however she states she took aspirin at home prior to coming to the ED.  Recheck at 6:30 AM patient has had one episode of chest pain in the ED.  She now mainly complains of nausea and that her toes and elbows and fingers are not red.  We discussed her test results.  Her  troponin is negative and that blood work was about 8-9 hours after her chest pain started.  She is interested in outpatient therapies for her heroin problem.  She was given Zofran ODT for her nausea.  Final Clinical Impressions(s) / ED Diagnoses   Final diagnoses:  Atypical chest pain  Heroin abuse [facility])    ED Discharge Orders    None     Plan discharge  Devoria Albe, MD, Concha Pyo, MD 09/07/17 [phone]

## 2017-09-11 ENCOUNTER — Emergency Department (HOSPITAL_COMMUNITY)
Admission: EM | Admit: 2017-09-11 | Discharge: 2017-09-11 | Disposition: A | Discharge status: Home / Self Care | Payer: Self-pay | Attending: Emergency Medicine | Admitting: Emergency Medicine

## 2017-09-11 ENCOUNTER — Other Ambulatory Visit: Payer: Self-pay

## 2017-09-11 ENCOUNTER — Encounter (HOSPITAL_COMMUNITY): Payer: Self-pay | Admitting: Emergency Medicine

## 2017-09-11 DIAGNOSIS — R079 Chest pain, unspecified: Secondary | ICD-10-CM | POA: Insufficient documentation

## 2017-09-11 NOTE — ED Triage Notes (Signed)
Pt c/o chest pain and right shoulder pain. Pt states the pain is the same since she was seen for the same Monday.

## 2017-09-11 NOTE — ED Provider Notes (Signed)
23:19 PM no one in room, EKG leads and pads on the stretcher with a gown. No one saw patient leave the room    EKG Interpretation  Date/Time:  Saturday September 11 2017 22:39:40 EDT Ventricular Rate:  88 PR Interval:    QRS Duration: 94 QT Interval:  370 QTC Calculation: 448 R Axis:   79 Text Interpretation:  Sinus rhythm ST elev, probable normal early repol pattern No significant change since last tracing 07 Sep 2017 Confirmed by Devoria AlbeKnapp, Areli Jowett ([id number]) on 09/11/2017 11:05:20 PM       Devoria AlbeIva Vadie Principato, MD, Concha PyoFACEP    Rushton Early, MD 09/11/17 [phone]

## 2017-09-11 NOTE — ED Notes (Signed)
EDP Lynelle DoctorKnapp went into the pt's room and the pt was gone. This RN went into the pt's room and the pt was not in there. Pt's ekg leads and stickers, blood pressure cuff and gown were laying on the bed.

## 2017-09-12 ENCOUNTER — Other Ambulatory Visit: Payer: Self-pay

## 2017-09-12 ENCOUNTER — Encounter (HOSPITAL_COMMUNITY): Payer: Self-pay | Admitting: *Deleted

## 2017-09-12 ENCOUNTER — Emergency Department (HOSPITAL_COMMUNITY)
Admission: EM | Admit: 2017-09-12 | Discharge: 2017-09-12 | Disposition: A | Discharge status: Home / Self Care | Payer: Self-pay | Attending: Emergency Medicine | Admitting: Emergency Medicine

## 2017-09-12 DIAGNOSIS — F17210 Nicotine dependence, cigarettes, uncomplicated: Secondary | ICD-10-CM | POA: Insufficient documentation

## 2017-09-12 DIAGNOSIS — Z79899 Other long term (current) drug therapy: Secondary | ICD-10-CM | POA: Insufficient documentation

## 2017-09-12 DIAGNOSIS — F1510 Other stimulant abuse, uncomplicated: Secondary | ICD-10-CM | POA: Insufficient documentation

## 2017-09-12 DIAGNOSIS — F1110 Opioid abuse, uncomplicated: Secondary | ICD-10-CM | POA: Insufficient documentation

## 2017-09-12 DIAGNOSIS — F1420 Cocaine dependence, uncomplicated: Secondary | ICD-10-CM | POA: Insufficient documentation

## 2017-09-12 DIAGNOSIS — E039 Hypothyroidism, unspecified: Secondary | ICD-10-CM | POA: Insufficient documentation

## 2017-09-12 DIAGNOSIS — Z008 Encounter for other general examination: Secondary | ICD-10-CM | POA: Insufficient documentation

## 2017-09-12 DIAGNOSIS — R0789 Other chest pain: Secondary | ICD-10-CM | POA: Insufficient documentation

## 2017-09-12 DIAGNOSIS — R232 Flushing: Secondary | ICD-10-CM | POA: Insufficient documentation

## 2017-09-12 LAB — RAPID URINE DRUG SCREEN, HOSP PERFORMED
Amphetamines: POSITIVE — AB
Barbiturates: NOT DETECTED
Benzodiazepines: POSITIVE — AB
Cocaine: NOT DETECTED
Opiates: POSITIVE — AB
Tetrahydrocannabinol: NOT DETECTED

## 2017-09-12 NOTE — ED Provider Notes (Signed)
[facility] EMERGENCY DEPARTMENT Provider Note   CSN: [id number] Arrival date & time: 09/12/17  0137  Time seen 02:20 AM   History   Chief Complaint Chief Complaint  Patient presents with  . Chest Pain  . Addiction Problem    HPI Nina Herrera is a 32 y.o. female.  HPI patient has a history of polysubstance abuse.  She was seen by me on April 9 for chest pain.  At that time I did a complete evaluation including xray of the chest, delta troponins.  Her chest pain was felt to be chest wall pain.  She was discharged home with her significant other.  Patient has a long history of heroin abuse.  She was in the ED earlier this evening and left prior to being seen.  She now returns via EMS.  I asked her what was wrong and she said she was having chest pain "earlier".  When I asked her when that was she states "I do not know".  She denies shortness of breath, she states the pain is aching and pressure.  She states she was given aspirin in the ambulance and she also took aspirin at home and an unknown period of time.  She states she is currently not having any chest pain she states "my blood flow is off".  She states her hands and legs "go in and out".  When I asked her what that means she states "I do not know".  She denies numbness or pain.  She denies known fevers or chills.  She denies cough, sore throat, nausea, vomiting, diarrhea, abdominal pain.  She denies chest pain at this time.  She denies suicidal ideation.  When I saw her earlier I gave her a resource guide because she was interested in stopping doing the heroin however she has not followed through with that when I asked her why she states "I do not know ".  I asked her about stopping the heroin and she states "I am not worth it".  She then starts crying and staying that she was a bad mother.  Per nurses working in the ED patient recently had a infant die and all of her children were taken away from her.  She states she is not suicidal.  She  said another time that "I am not worth it".  She initially did not want to have a mental health evaluation then she agreed.  Patient is very labile and changes her answers frequently.  PCP Patient, No Pcp Per   Past Medical History:  Diagnosis Date  . Hypothyroidism    age 32,   . Polysubstance abuse [facility](HCC)     Patient Active Problem List   Diagnosis Date Noted  . Polysubstance abuse (HCC) 02/18/2016  . Opioid dependence (HCC) 02/12/2016  . Hypothyroidism   . GAD (generalized anxiety disorder) 09/02/2015  . Irregular menses 06/30/2011  . Hair loss 06/30/2011  . Brittle nails 06/30/2011  . Autoimmune thyroiditis 06/30/2011  . Depression 06/30/2011    Past Surgical History:  Procedure Laterality Date  . NO PAST SURGERIES       OB History    Gravida  2   Para  2   Term  2   Preterm      AB      Living  1     SAB      TAB      Ectopic      Multiple  0   Live Births  2            Home Medications    Prior to Admission medications   Medication Sig Start Date End Date Taking? Authorizing Provider  desogestrel-ethinyl estradiol (APRI,EMOQUETTE,SOLIA) 0.15-30 MG-MCG tablet Take 1 tablet by mouth daily. 03/15/17   Lazaro Arms, MD  hydrOXYzine (ATARAX/VISTARIL) 25 MG tablet Take 0.5-2 tablets (12.5-50 mg total) by mouth 3 (three) times daily as needed for itching. 06/29/17   Valarie Cones, Dema Severin, PA-C    Family History Family History  Problem Relation Age of Onset  . Stroke Mother     Social History Social History   Tobacco Use  . Smoking status: Current Every Day Smoker    Packs/day: 0.00    Years: 13.00    Pack years: 0.00    Types: E-cigarettes  . Smokeless tobacco: Never Used  Substance Use Topics  . Alcohol use: No  . Drug use: Yes    Types: Methamphetamines    Comment: heroin and crystal meth  unemployed   Allergies   Patient has no known allergies.   Review of Systems Review of Systems  All other systems reviewed and are  negative.    Physical Exam Updated Vital Signs BP 113/69   Temp 98.3 F (36.8 C) (Oral)   Ht 5\' 6"  (1.676 m)   Wt 51.3 kg (113 lb)   LMP 07/04/2017   SpO2 100%   BMI 18.24 kg/m   Vital signs normal    Physical Exam  Constitutional: She is oriented to person, place, and time.  Non-toxic appearance. She does not appear ill. No distress.  Underweight, as am trying to talk to the patient she rolls over and covers her head with the blankets.  Had to ask her several times to wake up and talk to me.  HENT:  Head: Normocephalic and atraumatic.  Right Ear: External ear normal.  Left Ear: External ear normal.  Nose: Nose normal. No mucosal edema or rhinorrhea.  Mouth/Throat: Oropharynx is clear and moist and mucous membranes are normal. No dental abscesses or uvula swelling.  Eyes: Pupils are equal, round, and reactive to light. Conjunctivae and EOM are normal.  Neck: Normal range of motion and full passive range of motion without pain. Neck supple.  Moves head freely during conversation  Cardiovascular: Normal rate, regular rhythm and normal heart sounds. Exam reveals no gallop and no friction rub.  No murmur heard. Pulmonary/Chest: Effort normal and breath sounds normal. No respiratory distress. She has no wheezes. She has no rhonchi. She has no rales. She exhibits no tenderness and no crepitus.  Abdominal: Soft. Normal appearance and bowel sounds are normal. She exhibits no distension. There is no tenderness. There is no rebound and no guarding.  Musculoskeletal: Normal range of motion. She exhibits no edema or tenderness.  Moves all extremities well.   Neurological: She is alert and oriented to person, place, and time. She has normal strength. No cranial nerve deficit.  Skin: Skin is warm, dry and intact. No rash noted. No erythema. No pallor.  Psychiatric: Her mood appears not anxious. Her affect is labile. Her speech is rapid and/or pressured. She is agitated. She expresses no  suicidal plans and no homicidal plans.  Nursing note and vitals reviewed.    ED Treatments / Results  Labs (all labs ordered are listed, but only abnormal results are displayed)   Results for orders placed or performed during the hospital encounter of 09/12/17  Urine rapid drug screen (hosp performed)  Result Value Ref Range   Opiates POSITIVE (A) NONE DETECTED   Cocaine NONE DETECTED NONE DETECTED   Benzodiazepines POSITIVE (A) NONE DETECTED   Amphetamines POSITIVE (A) NONE DETECTED   Tetrahydrocannabinol NONE DETECTED NONE DETECTED   Barbiturates NONE DETECTED NONE DETECTED   Laboratory interpretation all normal except +UDS as expected      Labs (all labs ordered are listed, but only abnormal results are displayed)      Results for orders placed or performed during the hospital encounter of 09/07/17  Comprehensive metabolic panel  Result Value Ref Range   Sodium 135 135 - 145 mmol/L   Potassium 4.2 3.5 - 5.1 mmol/L   Chloride 100 (L) 101 - 111 mmol/L   CO2 26 22 - 32 mmol/L   Glucose, Bld 112 (H) 65 - 99 mg/dL   BUN 16 6 - 20 mg/dL   Creatinine, Ser 4.78 0.44 - 1.00 mg/dL   Calcium 9.3 8.9 - 29.5 mg/dL   Total Protein 7.2 6.5 - 8.1 g/dL   Albumin 4.2 3.5 - 5.0 g/dL   AST 21 15 - 41 U/L   ALT 17 14 - 54 U/L   Alkaline Phosphatase 43 38 - 126 U/L   Total Bilirubin 0.4 0.3 - 1.2 mg/dL   GFR calc non Af Amer >60 >60 mL/min   GFR calc Af Amer >60 >60 mL/min   Anion gap 9 5 - 15  Troponin I  Result Value Ref Range   Troponin I <0.03 <0.03 ng/mL  CBC with Differential  Result Value Ref Range   WBC 5.1 4.0 - 10.5 K/uL   RBC 4.25 3.87 - 5.11 MIL/uL   Hemoglobin 12.8 12.0 - 15.0 g/dL   HCT 62.1 30.8 - 65.7 %   MCV 88.7 78.0 - 100.0 fL   MCH 30.1 26.0 - 34.0 pg   MCHC 34.0 30.0 - 36.0 g/dL   RDW 84.6 96.2 - 95.2 %   Platelets 240 150 - 400 K/uL   Neutrophils Relative % 58 %   Neutro Abs 3.0 1.7 - 7.7 K/uL   Lymphocytes Relative 32 %    Lymphs Abs 1.6 0.7 - 4.0 K/uL   Monocytes Relative 7 %   Monocytes Absolute 0.3 0.1 - 1.0 K/uL   Eosinophils Relative 3 %   Eosinophils Absolute 0.2 0.0 - 0.7 K/uL   Basophils Relative 0 %   Basophils Absolute 0.0 0.0 - 0.1 K/uL  D-dimer, quantitative  Result Value Ref Range   D-Dimer, Quant <0.27 0.00 - 0.50 ug/mL-FEU   Laboratory interpretation all normal       EKG EKG Interpretation  Date/Time:  Sunday September 12 2017 01:54:44 EDT Ventricular Rate:  81 PR Interval:    QRS Duration: 94 QT Interval:  386 QTC Calculation: 448 R Axis:   80 Text Interpretation:  Sinus rhythm ST elev, probable normal early repol pattern No significant change since last tracing 11 Sep 2017 Confirmed by Devoria Albe ([id number]) on 09/12/2017 2:18:22 AM   Radiology No results found.  September 07, 2017 CLINICAL DATA:  32 y/o  F; chest pain and heart racing for 2 hours.  EXAM: CHEST - 2 VIEW  COMPARISON:  None.  FINDINGS: Normal cardiac silhouette. Hyperinflated lungs and flattened diaphragms. Clear lungs. No pleural effusion or pneumothorax. No acute osseous abnormality is evident.  IMPRESSION: Hyperinflated lungs.  No acute pulmonary process identified.   Electronically Signed   By: Mitzi Hansen M.D.   On: 09/07/2017 05:09  Procedures Procedures (  including critical care time)  Medications Ordered in ED Medications - No data to display   Initial Impression / Assessment and Plan / ED Course  I have reviewed the triage vital signs and the nursing notes.  Pertinent labs & imaging results that were available during my care of the patient were reviewed by me and considered in my medical decision making (see chart for details).     TTS consult was ordered.  They gave patient outpatient referrals that I had already given her earlier.  Recheck patient at 3:45 AM she seems much calmer now she sitting up able to hold conversation.  She now complains of feeling of  her hands and feet flushing and she has a fleeting sensation that goes from her neck to her head to her hands to her feet.  She states it rotates around.  She cannot tell me how long this been going on or describe it other than her skin looks red. .  Patient's hands and feet are red and appear flushed.  The flushing is most likely related to her drug abuse.  She was again given the outpatient referrals by TTS tonight.  Final Clinical Impressions(s) / ED Diagnoses   Final diagnoses:  Heroin abuse (HCC)  Methamphetamine abuse (HCC)  Flushing  Chest wall pain    ED Discharge Orders    None     Plan discharge  Devoria Albe, MD, Concha Pyo, MD 09/12/17 [phone]

## 2017-09-12 NOTE — ED Notes (Signed)
Pt's IV removed [facility]

## 2017-09-12 NOTE — Discharge Instructions (Addendum)
Look at the information you got in the ED your last visit and the information you were given tonight by the mental health counselor tonight to get help for your heroin and crystal meth addiction.

## 2017-09-12 NOTE — ED Notes (Signed)
Pt keeps calling out stating her IV is hurting. Per Dr. Lynelle DoctorKnapp, the pt's IV can be taken out.

## 2017-09-12 NOTE — ED Notes (Signed)
Pt refused vitals 

## 2017-09-12 NOTE — ED Triage Notes (Addendum)
Pt reports right shoulder and chest pain/tightness with right hand and right toes that are numb and tingling.   Per [facility], the pt has admitted to heroin use today. Pt very jittery. Pt also made aware that this RN was her nurse earlier in the night when she left without saying anything. Pt has been made aware that if she leaves with her IV in place, she will be arrested.
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
## 2017-09-14 ENCOUNTER — Emergency Department (HOSPITAL_COMMUNITY): Payer: Self-pay

## 2017-09-14 ENCOUNTER — Encounter (HOSPITAL_COMMUNITY): Payer: Self-pay | Admitting: *Deleted

## 2017-09-14 ENCOUNTER — Emergency Department (HOSPITAL_COMMUNITY)
Admission: EM | Admit: 2017-09-14 | Discharge: 2017-09-14 | Disposition: A | Discharge status: Home / Self Care | Payer: Self-pay | Attending: Emergency Medicine | Admitting: Emergency Medicine

## 2017-09-14 ENCOUNTER — Other Ambulatory Visit: Payer: Self-pay

## 2017-09-14 DIAGNOSIS — R0789 Other chest pain: Secondary | ICD-10-CM

## 2017-09-14 DIAGNOSIS — R45851 Suicidal ideations: Secondary | ICD-10-CM

## 2017-09-14 DIAGNOSIS — R002 Palpitations: Secondary | ICD-10-CM

## 2017-09-14 DIAGNOSIS — E039 Hypothyroidism, unspecified: Secondary | ICD-10-CM | POA: Insufficient documentation

## 2017-09-14 DIAGNOSIS — Z5321 Procedure and treatment not carried out due to patient leaving prior to being seen by health care provider: Secondary | ICD-10-CM | POA: Insufficient documentation

## 2017-09-14 DIAGNOSIS — R509 Fever, unspecified: Secondary | ICD-10-CM | POA: Insufficient documentation

## 2017-09-14 DIAGNOSIS — F17210 Nicotine dependence, cigarettes, uncomplicated: Secondary | ICD-10-CM | POA: Insufficient documentation

## 2017-09-14 DIAGNOSIS — F1910 Other psychoactive substance abuse, uncomplicated: Secondary | ICD-10-CM | POA: Diagnosis present

## 2017-09-14 LAB — CBC WITH DIFFERENTIAL/PLATELET
BASOS ABS: 0 10*3/uL (ref 0.0–0.1)
Basophils Relative: 0 %
Eosinophils Absolute: 0 10*3/uL (ref 0.0–0.7)
Eosinophils Relative: 1 %
HEMATOCRIT: 35 % — AB (ref 36.0–46.0)
HEMOGLOBIN: 11.9 g/dL — AB (ref 12.0–15.0)
Lymphocytes Relative: 27 %
Lymphs Abs: 1.9 10*3/uL (ref 0.7–4.0)
MCH: 30.1 pg (ref 26.0–34.0)
MCHC: 34 g/dL (ref 30.0–36.0)
MCV: 88.6 fL (ref 78.0–100.0)
MONO ABS: 0.5 10*3/uL (ref 0.1–1.0)
Monocytes Relative: 7 %
NEUTROS ABS: 4.8 10*3/uL (ref 1.7–7.7)
NEUTROS PCT: 65 %
Platelets: 298 10*3/uL (ref 150–400)
RBC: 3.95 MIL/uL (ref 3.87–5.11)
RDW: 12.1 % (ref 11.5–15.5)
WBC: 7.2 10*3/uL (ref 4.0–10.5)

## 2017-09-14 LAB — BASIC METABOLIC PANEL
Anion gap: 10 (ref 5–15)
BUN: 16 mg/dL (ref 6–20)
CALCIUM: 9.3 mg/dL (ref 8.9–10.3)
CO2: 25 mmol/L (ref 22–32)
Chloride: 104 mmol/L (ref 101–111)
Creatinine, Ser: 0.69 mg/dL (ref 0.44–1.00)
GFR calc Af Amer: >60 mL/min (ref >60)
Glucose, Bld: 95 mg/dL (ref 65–99)
Potassium: 3.9 mmol/L (ref 3.5–5.1)
Sodium: 139 mmol/L (ref 135–145)

## 2017-09-14 LAB — RAPID URINE DRUG SCREEN, HOSP PERFORMED
Amphetamines: POSITIVE — AB
Barbiturates: NOT DETECTED
Benzodiazepines: NOT DETECTED
Cocaine: NOT DETECTED
OPIATES: POSITIVE — AB
TETRAHYDROCANNABINOL: NOT DETECTED

## 2017-09-14 LAB — ETHANOL: Alcohol, Ethyl (B): <10 mg/dL (ref <10)

## 2017-09-14 LAB — TROPONIN I: Troponin I: <0.03 ng/mL (ref <0.03)

## 2017-09-14 LAB — TSH: TSH: 0.782 u[IU]/mL (ref 0.350–4.500)

## 2017-09-14 NOTE — Consult Note (Signed)
Telepsych Consultation   Reason for Consult: Suicidal ideation ED visit Referring Physician:  Dr. Tomi Bamberger Location of Patient:  Location of Provider: Other: [facility]  Patient Identification: BERENIS CORTER MRN:  [id number] Principal Diagnosis: Polysubstance abuse [facility]) Diagnosis:   Patient Active Problem List   Diagnosis Date Noted  . Polysubstance abuse ([place]) [F19.10] 02/18/2016  . Opioid dependence ([place]) [F11.20] 02/12/2016  . Hypothyroidism [E03.9]   . GAD (generalized anxiety disorder) [F41.1] 09/02/2015  . Irregular menses [N92.6] 06/30/2011  . Hair loss [L65.9] 06/30/2011  . Brittle nails [L60.3] 06/30/2011  . Autoimmune thyroiditis [E06.3] 06/30/2011  . Depression [F32.9] 06/30/2011    Total Time spent with patient: 30 minutes  Subjective:   SAVINA OLSHEFSKI is a 32 y.o. female patient admitted with depression and worsening fatigue.  Patient states she has a chronic history of herion use x2 years.  She reports decreased heroin use, using less than 1 gram a day.   She is interested and long-term drug rehabilitation program. Patient required much redirection, Throughout the evaluation she was observed  Attempting to lie down stating she just wanted to rest.   HPI: STAYSHA TRUBY is an 32 y.o. female who presented in [facility] ED approximately two days ago seeking help for herself for her drug problem.  Patient was seen, discharged and referred to [facility], but did not follow-up with her treatment recommendation.  She returned to the ED this morning still seeking help for her SA issue, but when staff was going to discharge her, she states that she did not feel safe to leave the ED.  When told she was being discharged, she stated:  "then I feel like I want to hurt myself." Patient did not have a plan of how that she would try to harm herself.  Patient states that she has no history of suicidal ideation or attempts.  Patient states that she has never been psychiatrically hospitalized, but  states that she has attempted to get help before at [facility] in [facility], but states that she never followed through with treatment.  She states that she has never been on mental health medications.  She denies HI/Psychosis.  Patient states that she has been using opioids since the age of 69 with very little history of having any clean time. She states that she last snorted a line of heroin two days ago.  Patient states that she has been using methamphetamines for the past two years, but was not specific as to how much she is using currently.  She states that she last used these substances two days ago.  Patient states that she went to [facility] two months ago and states that she was able to stay clean "for awhile, " but patient coulld not identfy how long that she was able to stay clean.  Patient states that she has been living with her boyfriend, but states that he also uses.  Patient states that she has lost custody of her 66 year old child who is in [place] custody due to her drug use.  Patient states that she is working with DSS in order to regain custody.  Patient states that she has a court date for Breaking and Entering.  When asked if she was on probation, she said that she was not sure.  Patient was not very cooperative during her assessment.  She was not engaged in the assessment process and states that she was "tired."  Patient appeared to be oriented, but was very  sleepy from having used two days ago.  Patient' recent and remote memory appeared to be intact and her thought processes were logical.  Her speech was soft and her eye contact was poor.    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  Risk to Self: Suicidal Ideation: Yes-Currently Present Suicidal Intent: No Is patient at risk for suicide?: Yes Suicidal Plan?: No Access to Means: No What has been your use of drugs/alcohol within the last 12 months?: (daily use) How many times?: 0 Other  Self Harm Risks: (none) Triggers for Past Attempts: None known Intentional Self Injurious Behavior: None Risk to Others: Homicidal Ideation: No Thoughts of Harm to Others: No Current Homicidal Intent: No Current Homicidal Plan: No Access to Homicidal Means: No Identified Victim: none History of harm to others?: No Assessment of Violence: None Noted Violent Behavior Description: denies Does patient have access to weapons?: No Criminal Charges Pending?: Yes Describe Pending Criminal Charges: (Breaking and Entering) Does patient have a court date: Yes Court Date: 09/14/17 Prior Inpatient Therapy: Prior Inpatient Therapy: No Prior Outpatient Therapy: Prior Outpatient Therapy: No Does patient have an ACCT team?: No Does patient have Intensive In-House Services?  : No Does patient have [facility] services? : No Does patient have P4CC services?: No  Past Medical History:  Past Medical History:  Diagnosis Date  . Hypothyroidism    age 48,   . Polysubstance abuse [facility])     Past Surgical History:  Procedure Laterality Date  . NO PAST SURGERIES     Family History:  Family History  Problem Relation Age of Onset  . Stroke Mother    Family Psychiatric  History: denies Social History:  Social History   Substance and Sexual Activity  Alcohol Use No     Social History   Substance and Sexual Activity  Drug Use Yes  . Types: Methamphetamines   Comment: heroin and crystal meth    Social History   Socioeconomic History  . Marital status: Single    Spouse name: Not on file  . Number of children: Not on file  . Years of education: Not on file  . Highest education level: Not on file  Occupational History  . Not on file  Social Needs  . Financial resource strain: Not on file  . Food insecurity:    Worry: Not on file    Inability: Not on file  . Transportation needs:    Medical: Not on file    Non-medical: Not on file  Tobacco Use  . Smoking status: Current Every Day  Smoker    Packs/day: 0.00    Years: 13.00    Pack years: 0.00    Types: E-cigarettes  . Smokeless tobacco: Never Used  Substance and Sexual Activity  . Alcohol use: No  . Drug use: Yes    Types: Methamphetamines    Comment: heroin and crystal meth  . Sexual activity: Yes    Birth control/protection: None  Lifestyle  . Physical activity:    Days per week: Not on file    Minutes per session: Not on file  . Stress: Not on file  Relationships  . Social connections:    Talks on phone: Not on file    Gets together: Not on file    Attends religious service: Not on file    Active member of club or organization: Not on file    Attends meetings of clubs or organizations: Not on file    Relationship  status: Not on file  Other Topics Concern  . Not on file  Social History Narrative  . Not on file   Additional Social History:    Allergies:  No Known Allergies  Labs:  Results for orders placed or performed during the hospital encounter of 09/14/17 (from the past 48 hour(s))  TSH     Status: None   Collection Time: 09/14/17  4:58 AM  Result Value Ref Range   TSH 0.782 0.350 - 4.500 uIU/mL    Comment: Performed by a 3rd Generation assay with a functional sensitivity of <=0.01 uIU/mL. Performed at [facility], [address] [place], [place] [address]   Troponin I     Status: None   Collection Time: 09/14/17  4:58 AM  Result Value Ref Range   Troponin I <0.03 <0.03 ng/mL    Comment: Performed at [facility], [address], [place], [place] [address]  Rapid urine drug screen (hospital performed)     Status: Abnormal   Collection Time: 09/14/17  5:54 AM  Result Value Ref Range   Opiates POSITIVE (A) NONE DETECTED   Cocaine NONE DETECTED NONE DETECTED   Benzodiazepines NONE DETECTED NONE DETECTED   Amphetamines POSITIVE (A) NONE DETECTED   Tetrahydrocannabinol NONE DETECTED NONE DETECTED   Barbiturates NONE DETECTED NONE DETECTED    Comment: (NOTE) DRUG SCREEN FOR MEDICAL  PURPOSES ONLY.  IF CONFIRMATION IS NEEDED FOR ANY PURPOSE, NOTIFY LAB WITHIN 5 DAYS. LOWEST DETECTABLE LIMITS FOR URINE DRUG SCREEN Drug Class                     Cutoff (ng/mL) Amphetamine and metabolites    1000 Barbiturate and metabolites    200 Benzodiazepine                 098 Tricyclics and metabolites     300 Opiates and metabolites        300 Cocaine and metabolites        300 THC                            50 Performed at [facility], [address] [place], [place] [address]   CBC with Differential     Status: Abnormal   Collection Time: 09/14/17  6:04 AM  Result Value Ref Range   WBC 7.2 4.0 - 10.5 K/uL   RBC 3.95 3.87 - 5.11 MIL/uL   Hemoglobin 11.9 (L) 12.0 - 15.0 g/dL   HCT 35.0 (L) 36.0 - 46.0 %   MCV 88.6 78.0 - 100.0 fL   MCH 30.1 26.0 - 34.0 pg   MCHC 34.0 30.0 - 36.0 g/dL   RDW 12.1 11.5 - 15.5 %   Platelets 298 150 - 400 K/uL   Neutrophils Relative % 65 %   Neutro Abs 4.8 1.7 - 7.7 K/uL   Lymphocytes Relative 27 %   Lymphs Abs 1.9 0.7 - 4.0 K/uL   Monocytes Relative 7 %   Monocytes Absolute 0.5 0.1 - 1.0 K/uL   Eosinophils Relative 1 %   Eosinophils Absolute 0.0 0.0 - 0.7 K/uL   Basophils Relative 0 %   Basophils Absolute 0.0 0.0 - 0.1 K/uL    Comment: Performed at [facility], [address], [place],  [address]  Basic metabolic panel     Status: None   Collection Time: 09/14/17  6:04 AM  Result Value Ref Range   Sodium 139 135 -  145 mmol/L   Potassium 3.9 3.5 - 5.1 mmol/L   Chloride 104 101 - 111 mmol/L   CO2 25 22 - 32 mmol/L   Glucose, Bld 95 65 - 99 mg/dL   BUN 16 6 - 20 mg/dL   Creatinine, Ser 0.69 0.44 - 1.00 mg/dL   Calcium 9.3 8.9 - 10.3 mg/dL   GFR calc non Af Amer >60 >60 mL/min   GFR calc Af Amer >60 >60 mL/min    Comment: (NOTE) The eGFR has been calculated using the CKD EPI equation. This calculation has not been validated in all clinical situations. eGFR's persistently <60 mL/min signify possible Chronic  Kidney Disease.    Anion gap 10 5 - 15    Comment: Performed at [facility], [address], [place], [place] [address]  Ethanol     Status: None   Collection Time: 09/14/17  6:04 AM  Result Value Ref Range   Alcohol, Ethyl (B) <10 <10 mg/dL    Comment:        LOWEST DETECTABLE LIMIT FOR SERUM ALCOHOL IS 10 mg/dL FOR MEDICAL PURPOSES ONLY Performed at [facility], [address] [place], [place] [address]     Medications:  No current facility-administered medications for this encounter.    Current Outpatient Medications  Medication Sig Dispense Refill  . hydrOXYzine (ATARAX/VISTARIL) 25 MG tablet Take 0.5-2 tablets (12.5-50 mg total) by mouth 3 (three) times daily as needed for itching. 40 tablet 0    Musculoskeletal: Strength & Muscle Tone: within normal limits Gait & Station: normal Patient leans: N/A  Psychiatric Specialty Exam: Physical Exam  ROS  Blood pressure 106/67, pulse 86, temperature 97.8 F (36.6 C), temperature source Oral, resp. rate 17, height _0  (1.676 m), weight 51.3 kg (113 lb), last menstrual period 08/04/2017, SpO2 97 %.Body mass index is 18.24 kg/m.  General Appearance: Disheveled and And purple scrubs  Eye Contact:  Minimum eye contact and does not appear to be engaged  Speech:  Clear and Coherent and Slow  Volume:  Decreased  Mood:  Irritable  Affect:  Congruent  Thought Process:  Linear and Descriptions of Associations: Intact  Orientation:  Full (Time, Place, and Person)  Thought Content:  Logical  Suicidal Thoughts:  No  Homicidal Thoughts:  No  Memory:  Immediate;   Fair Recent;   Fair  Judgement:  Fair  Insight:  Fair  Psychomotor Activity:  Normal  Concentration:  Concentration: Fair and Attention Span: Fair  Recall:  [employer] of Knowledge:  Fair  Language:  Fair  Akathisia:  No  Handed:  Right  AIMS (if indicated):     Assets:  Communication Skills Desire for Improvement Financial Resources/Insurance Leisure  Time Physical Health Transportation Vocational/Educational  ADL's:  Intact  Cognition:  WNL  Sleep:        Treatment Plan Summary: Daily contact with patient to assess and evaluate symptoms and progress in treatment, Medication management and Plan Discharge home at this time with additional resources to long-term drug rehabilitation programs.  Case discussed with Dr. Dwyane Dee patient does not meet criteria for inpatient at this time.  She was encouraged to follow-up with appointments and referrals that have been coordinated for her care.  Throughout the evaluation patient continuously refused it suicidal ideation, homicidal ideations and auditory visual hallucinations.  Disposition: No evidence of imminent risk to self or others at present.   Patient does not meet criteria for psychiatric inpatient admission. Supportive therapy provided about ongoing stressors.  Discussed crisis plan, support from social network, calling 911, coming to the Emergency Department, and calling Suicide Hotline.  This service was provided via telemedicine using a 2-way, interactive audio and video technology.  Names of all persons participating in this telemedicine service and their role in this encounter. Name: Priscille Loveless Role: FNP-BC    Nanci Pina, FNP 09/14/2017 10:10 AM

## 2017-09-14 NOTE — ED Provider Notes (Signed)
[facility] EMERGENCY DEPARTMENT Provider Note   CSN: [id number] Arrival date & time: 09/14/17  0351     History   Chief Complaint Chief Complaint  Patient presents with  . Chest Pain    HPI Cyril Loosenshley N Nina Herrera is a 32 y.o. female.  HPI  This is a 32 year old female with history of polysubstance abuse, hypothyroidism who presents with palpitations.  Patient reports palpitations worse at night.  She reports symptoms over the last week.  She has been seen and evaluated twice for similar symptoms.  She has a history of polysubstance abuse with heroin and methamphetamines.  Last use was yesterday morning.  She reports that was heroin.  She denies any cocaine use or methamphetamines in the last 24 hours.  She denies any chest pain.  She does report some shortness of breath with palpitations.  Currently she is symptom-free.  She denies any recent dietary changes or increased caffeine use.  Denies recent fevers or cough.  Chart review reveals workup several days ago on April 9 including chest x-ray, troponin, EKG, d-dimer.  All was reassuring.  She has not followed up with a primary physician yet.  Past Medical History:  Diagnosis Date  . Hypothyroidism    age 32,   . Polysubstance abuse [facility])     Patient Active Problem List   Diagnosis Date Noted  . Polysubstance abuse ([facility]) 02/18/2016  . Opioid dependence ([facility]) 02/12/2016  . Hypothyroidism   . GAD (generalized anxiety disorder) 09/02/2015  . Irregular menses 06/30/2011  . Hair loss 06/30/2011  . Brittle nails 06/30/2011  . Autoimmune thyroiditis 06/30/2011  . Depression 06/30/2011    Past Surgical History:  Procedure Laterality Date  . NO PAST SURGERIES       OB History    Gravida  2   Para  2   Term  2   Preterm      AB      Living  1     SAB      TAB      Ectopic      Multiple  0   Live Births  2            Home Medications    Prior to Admission medications   Medication Sig Start Date End Date  Taking? Authorizing Provider  desogestrel-ethinyl estradiol (APRI,EMOQUETTE,SOLIA) 0.15-30 MG-MCG tablet Take 1 tablet by mouth daily. 03/15/17   Lazaro ArmsEure, Luther H, MD  hydrOXYzine (ATARAX/VISTARIL) 25 MG tablet Take 0.5-2 tablets (12.5-50 mg total) by mouth 3 (three) times daily as needed for itching. 06/29/17   Valarie ConesWeber, Dema SeverinSarah L, PA-C    Family History Family History  Problem Relation Age of Onset  . Stroke Mother     Social History Social History   Tobacco Use  . Smoking status: Current Every Day Smoker    Packs/day: 0.00    Years: 13.00    Pack years: 0.00    Types: E-cigarettes  . Smokeless tobacco: Never Used  Substance Use Topics  . Alcohol use: No  . Drug use: Yes    Types: Methamphetamines    Comment: heroin and crystal meth     Allergies   Patient has no known allergies.   Review of Systems Review of Systems  Constitutional: Negative for fever.  Respiratory: Positive for shortness of breath.   Cardiovascular: Positive for palpitations. Negative for chest pain and leg swelling.  Gastrointestinal: Negative for abdominal pain, nausea and vomiting.  Genitourinary: Negative for dysuria.  Neurological:  Negative for headaches.  All other systems reviewed and are negative.    Physical Exam Updated Vital Signs BP 106/67   Pulse 86   Temp 97.8 F (36.6 C) (Oral)   Resp 17   Ht 5\' 6"  (1.676 m)   Wt 51.3 kg (113 lb)   LMP 08/04/2017   SpO2 97%   BMI 18.24 kg/m   Physical Exam  Constitutional: She is oriented to person, place, and time.  Appears older than stated age, no acute distress  HENT:  Head: Normocephalic and atraumatic.  Cardiovascular: Normal rate, regular rhythm, normal heart sounds and normal pulses.    No systolic murmur is present. Pulmonary/Chest: Effort normal. No respiratory distress. She has no wheezes.  Abdominal: Soft. Bowel sounds are normal.  Neurological: She is alert and oriented to person, place, and time.  Skin: Skin is warm and  dry.  Track marks bilateral upper extremities  Psychiatric: She has a normal mood and affect.  Nursing note and vitals reviewed.    ED Treatments / Results  Labs (all labs ordered are listed, but only abnormal results are displayed) Labs Reviewed  TSH  TROPONIN I  CBC WITH DIFFERENTIAL/PLATELET  BASIC METABOLIC PANEL  ETHANOL  RAPID URINE DRUG SCREEN, HOSP PERFORMED    EKG EKG Interpretation  Date/Time:  Tuesday September 14 2017 03:51:28 EDT Ventricular Rate:  101 PR Interval:    QRS Duration: 98 QT Interval:  370 QTC Calculation: 480 R Axis:   85 Text Interpretation:  Sinus tachycardia RSR' in V1 or V2, right VCD or RVH Borderline prolonged QT interval Confirmed by Ross Marcus ([id number]) on 09/14/2017 4:29:14 AM   Radiology Dg Chest 2 View  Result Date: 09/14/2017 CLINICAL DATA:  Intermittent chest pain for 1 week. EXAM: CHEST - 2 VIEW COMPARISON:  Chest radiograph September 07, 2017 FINDINGS: Cardiomediastinal silhouette is normal. No pleural effusions or focal consolidations. LEFT lung base breast attenuation on frontal radiograph. Trachea projects midline and there is no pneumothorax. Similar hyperinflation. Soft tissue planes and included osseous structures are non-suspicious. IMPRESSION: Stable hyperinflation without focal consolidation. Electronically Signed   By: Awilda Metro M.D.   On: 09/14/2017 04:36    Procedures Procedures (including critical care time)  Medications Ordered in ED Medications - No data to display   Initial Impression / Assessment and Plan / ED Course  I have reviewed the triage vital signs and the nursing notes.  Pertinent labs & imaging results that were available during my care of the patient were reviewed by me and considered in my medical decision making (see chart for details).     Patient presents with palpitations.  She denies chest pain at this time.  She is currently asymptomatic.  Initial heart rate 109.  She is overall nontoxic  appearing and vital signs are otherwise reassuring.  She had a full workup including a d-dimer several days ago.  EKG shows sinus tachycardia.  Chest x-ray shows no evidence of pneumothorax or pneumonia.  Repeat troponin is negative.  She does have a history of hypothyroidism.  TSH is pending.  However, she does not appear to be in thyroid storm.  Discussed with patient reassuring workup.  She has no murmur and I have low suspicion at this time for endocarditis although her substance abuse history would put her at risk.  Patient states "I feel very weak and I can even walk right now."  I have requested nursing ambulate the patient.  Feel she will be stable for discharge  home and follow-up at [facility] wellness clinic.  She may ultimately need Holter monitoring.  I discussed this with the patient.  5:54 AM And refusing to leave.  Patient endorsed to nursing suicidal ideation.  On my reevaluation, patient states "something is just wrong with me."  I have reassured her that she has had a thorough workup and her vital signs are reassuring.  She states "I cannot leave."  She reports that she is now suicidal.  She does not have a plan.  She denies any history of suicidal ideation.  I discussed with her the implications of her statement and she understands that she will need to be psychiatric evaluated if she truly has suicidal ideation.  I have reviewed her chart and there is mention of her losing a child and losing custody of her other children.  She will not discuss this with me.  From my perspective she is medically clear.  Additional workup was added for full clearance.  We will consult TTS.  Final Clinical Impressions(s) / ED Diagnoses   Final diagnoses:  Palpitations  Atypical chest pain  Suicidal ideation    ED Discharge Orders    None       Horton, Mayer Masker, MD 09/14/17 [phone]

## 2017-09-14 NOTE — Discharge Instructions (Addendum)
You were seen today for palpitations.  You need to avoid drug use.  Your thyroid testing is pending.  You will receive a phone call if this is abnormal.  Make sure to stay hydrated.  Follow-up at the clinic.  He had been seen and cleared by behavioral health for discharge home.  Outpatient resources provided.

## 2017-09-14 NOTE — ED Triage Notes (Signed)
Pt c/o chest pain x 1 week intermittently; pt has been seen here numerous times for same complaint; [facility] and left from there to come here

## 2017-09-14 NOTE — ED Notes (Signed)
Pt updated on wait time; reports she can't stay for a 9 hour wait. Encouraged pt to stay; informed pt to stop at [facility] if she decides to leave

## 2017-09-14 NOTE — ED Notes (Signed)
In to have pt sign discharge paperwork when pt stated that she did not feel that she was able to go home. Explained to her that based on her workup today, she was. Pt then made the comment "then I feel like I want to hurt myself". I asked pt if she felt like hurting herself or was planning on hurting herself to which pt replied " whatever will keep me here". Dr Wilkie AyeHorton notified of this and to bedside to speak with pt. Pt states she does not feel ready to leave and continued to say same as previously documented in this note. Dr Wilkie AyeHorton explained to her that she would be IVC'd as a result. Pt acknowledged her understanding.

## 2017-09-14 NOTE — ED Triage Notes (Signed)
Pt has been having generalized body aches, congestion, and fever for several days

## 2017-09-14 NOTE — ED Notes (Signed)
Pt ambulated in hallway without difficulty
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
## 2017-09-14 NOTE — ED Provider Notes (Signed)
Patient cleared by behavioral health for discharge home.  Resource guide provided.   Nina Herrera, Nina Kaufhold, MD 09/14/17 [phone]

## 2017-09-14 NOTE — ED Notes (Signed)
Pt given breakfast tray

## 2019-09-24 IMAGING — DX DG CHEST 2V
2 series · 2 of 2 positions shown · non-contrast
Comparison: Chest radiograph September 07, 2017

CLINICAL DATA: Intermittent chest pain for 1 week.

EXAM:
CHEST - 2 VIEW

[chest pa]
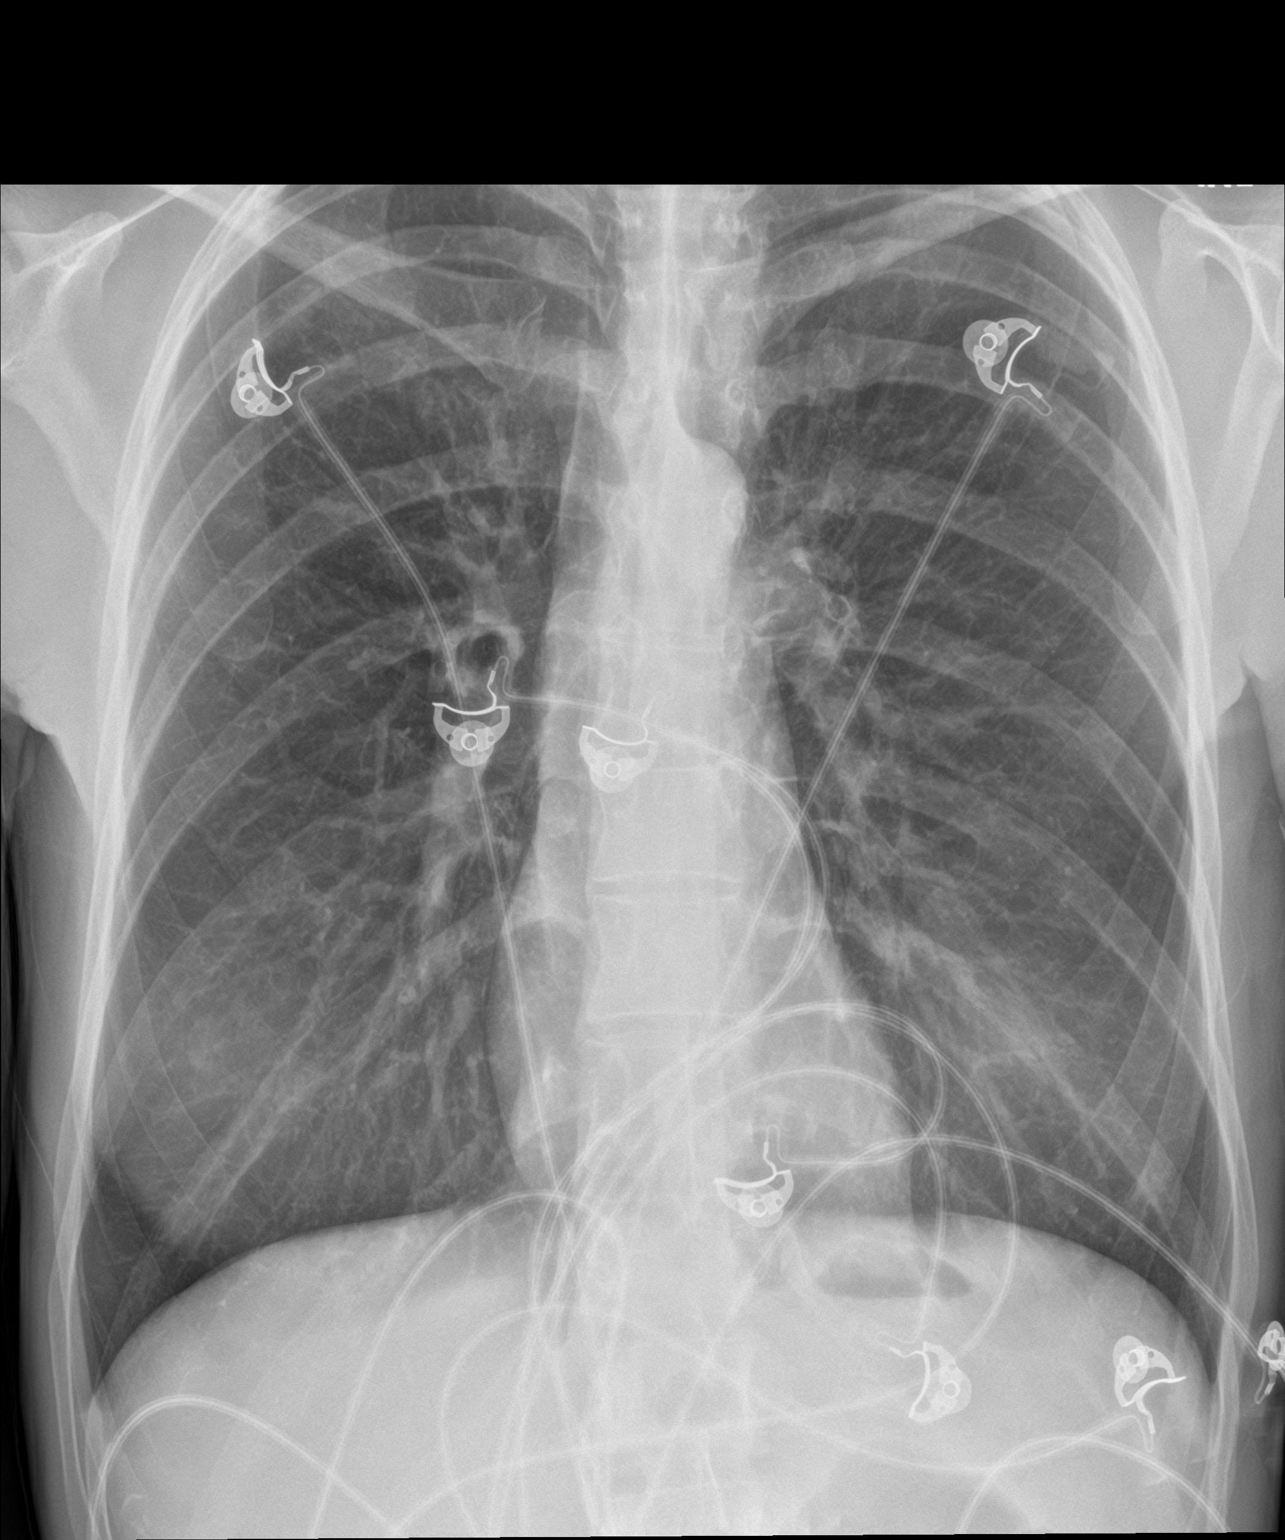

[chest lat]
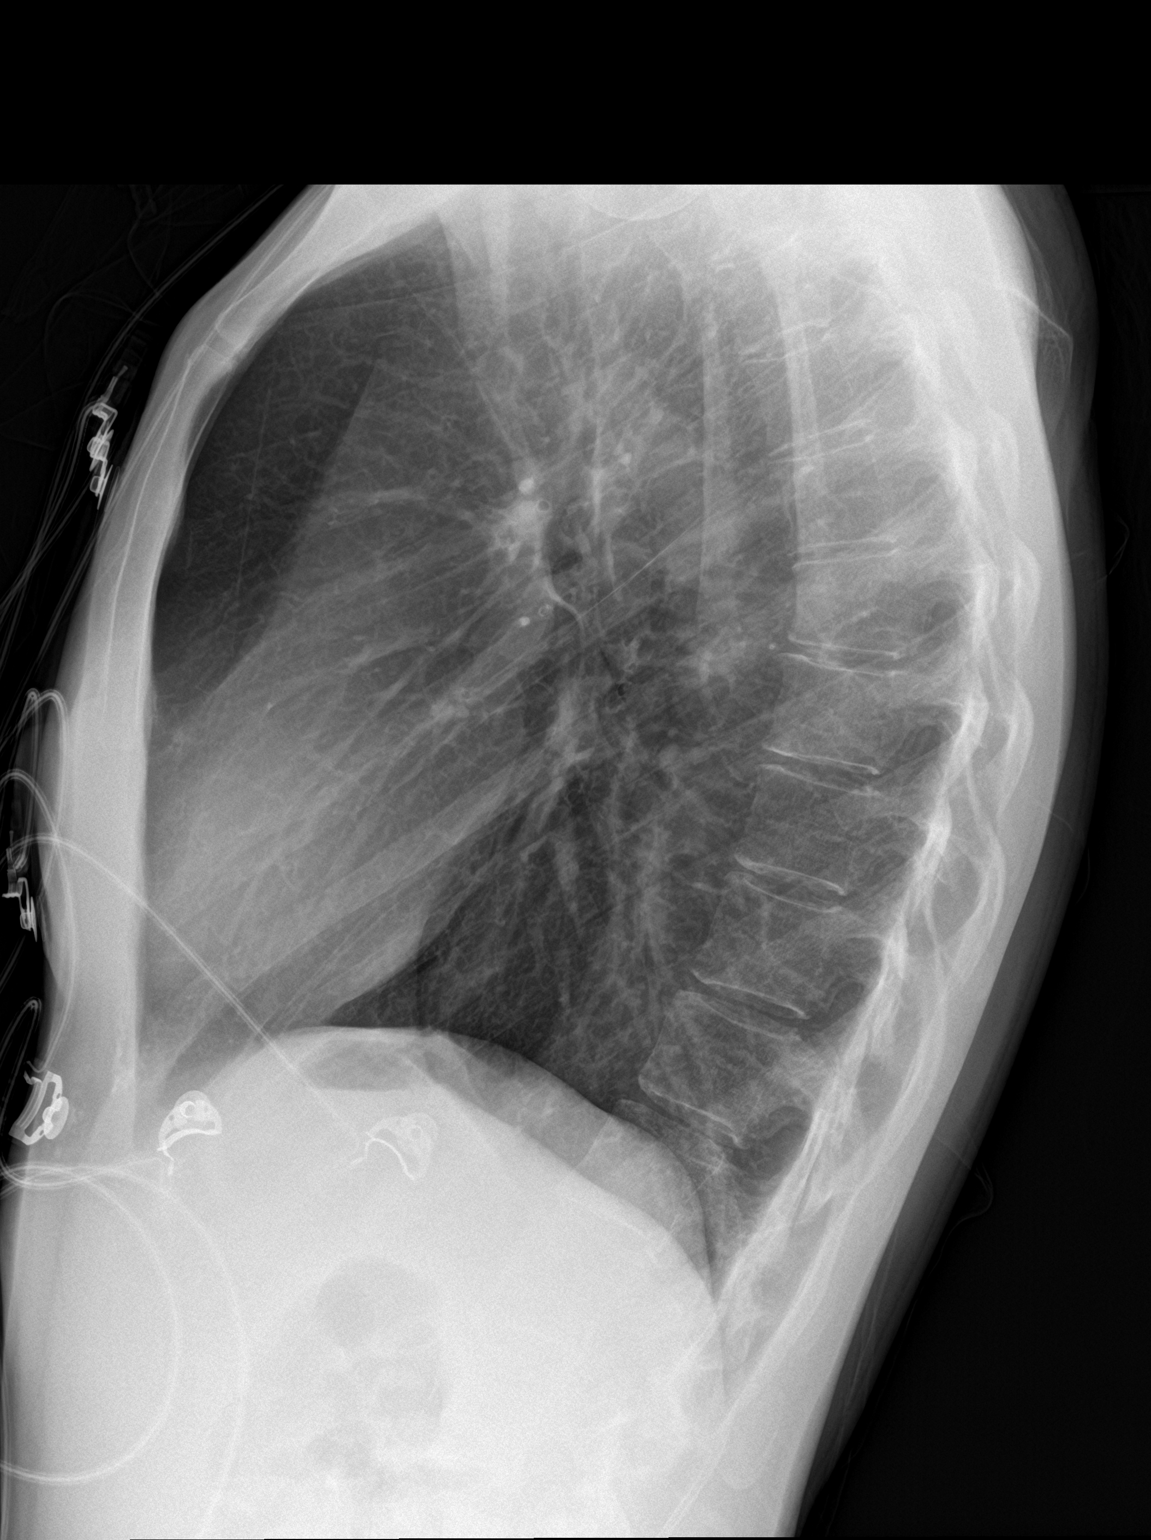

[2 of 2 positions shown; findings below may reference images not displayed]

FINDINGS: Cardiomediastinal silhouette is normal. No pleural effusions or
focal consolidations. LEFT lung base breast attenuation on frontal
radiograph. Trachea projects midline and there is no pneumothorax.
Similar hyperinflation. Soft tissue planes and included osseous
structures are non-suspicious.
IMPRESSION: Stable hyperinflation without focal consolidation.
